# Patient Record
Sex: Female | Born: 1985 | Race: White | Hispanic: No | Marital: Single | Smoking: Former smoker
Health system: Southern US, Community
[De-identification: ages and names within clinical notes are randomized; demographics above are authoritative.]

---
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
## 2004-04-03 HISTORY — PX: CHOLECYSTECTOMY: SHX55

## 2004-11-22 ENCOUNTER — Encounter: Admission: RE | Admit: 2004-11-22 | Discharge: 2004-11-22 | Payer: Self-pay | Admitting: Family Medicine

## 2004-11-25 ENCOUNTER — Other Ambulatory Visit: Admission: RE | Admit: 2004-11-25 | Discharge: 2004-11-25 | Payer: Self-pay | Admitting: Gynecology

## 2004-12-02 ENCOUNTER — Encounter: Admission: RE | Admit: 2004-12-02 | Discharge: 2004-12-02 | Payer: Self-pay | Admitting: Gastroenterology

## 2004-12-14 ENCOUNTER — Encounter: Admission: RE | Admit: 2004-12-14 | Discharge: 2004-12-14 | Payer: Self-pay | Admitting: Gastroenterology

## 2005-01-25 ENCOUNTER — Emergency Department (HOSPITAL_COMMUNITY): Admission: EM | Admit: 2005-01-25 | Discharge: 2005-01-26 | Payer: Self-pay | Admitting: Emergency Medicine

## 2005-03-20 ENCOUNTER — Ambulatory Visit (HOSPITAL_COMMUNITY): Admission: RE | Admit: 2005-03-20 | Discharge: 2005-03-21 | Payer: Self-pay | Admitting: Surgery

## 2005-03-20 ENCOUNTER — Encounter (INDEPENDENT_AMBULATORY_CARE_PROVIDER_SITE_OTHER): Payer: Self-pay | Admitting: Specialist

## 2005-04-04 ENCOUNTER — Encounter: Admission: RE | Admit: 2005-04-04 | Discharge: 2005-04-04 | Payer: Self-pay | Admitting: Family Medicine

## 2005-08-09 ENCOUNTER — Other Ambulatory Visit: Admission: RE | Admit: 2005-08-09 | Discharge: 2005-08-09 | Payer: Self-pay | Admitting: Gynecology

## 2006-02-21 ENCOUNTER — Other Ambulatory Visit: Admission: RE | Admit: 2006-02-21 | Discharge: 2006-02-21 | Payer: Self-pay | Admitting: Gynecology

## 2007-11-21 ENCOUNTER — Emergency Department (HOSPITAL_COMMUNITY): Admission: EM | Admit: 2007-11-21 | Discharge: 2007-11-21 | Payer: Self-pay | Admitting: Emergency Medicine

## 2009-10-21 ENCOUNTER — Inpatient Hospital Stay (HOSPITAL_COMMUNITY): Admission: EM | Admit: 2009-10-21 | Discharge: 2009-10-23 | Payer: Self-pay | Admitting: Emergency Medicine

## 2010-06-18 LAB — DIFFERENTIAL
Basophils Absolute: 0.1 10*3/uL (ref 0.0–0.1)
Basophils Relative: 1 % (ref 0–1)
Eosinophils Absolute: 0.1 10*3/uL (ref 0.0–0.7)
Eosinophils Relative: 1 % (ref 0–5)
Lymphocytes Relative: 13 % (ref 12–46)
Monocytes Absolute: 0.7 10*3/uL (ref 0.1–1.0)
Monocytes Relative: 6 % (ref 3–12)
Neutro Abs: 9.4 10*3/uL — ABNORMAL HIGH (ref 1.7–7.7)
Neutrophils Relative %: 79 % — ABNORMAL HIGH (ref 43–77)

## 2010-06-18 LAB — CULTURE, BLOOD (ROUTINE X 2): Culture: NO GROWTH

## 2010-06-18 LAB — BASIC METABOLIC PANEL
Chloride: 107 mEq/L (ref 96–112)
Chloride: 110 mEq/L (ref 96–112)
Creatinine, Ser: 0.95 mg/dL (ref 0.4–1.2)
GFR calc Af Amer: >60 mL/min (ref >60)
GFR calc Af Amer: >60 mL/min (ref >60)
Potassium: 4 mEq/L (ref 3.5–5.1)

## 2010-06-18 LAB — CBC
HCT: 36.1 % (ref 36.0–46.0)
Hemoglobin: 13.2 g/dL (ref 12.0–15.0)
MCH: 33.1 pg (ref 26.0–34.0)
MCH: 33.2 pg (ref 26.0–34.0)
MCH: 34.1 pg — ABNORMAL HIGH (ref 26.0–34.0)
MCHC: 35.1 g/dL (ref 30.0–36.0)
MCV: 97 fL (ref 78.0–100.0)
Platelets: 218 10*3/uL (ref 150–400)
Platelets: 247 10*3/uL (ref 150–400)
RDW: 13.8 % (ref 11.5–15.5)
RDW: 14.1 % (ref 11.5–15.5)
RDW: 14.2 % (ref 11.5–15.5)

## 2010-06-18 LAB — POCT I-STAT, CHEM 8
Creatinine, Ser: 0.9 mg/dL (ref 0.4–1.2)
Glucose, Bld: 86 mg/dL (ref 70–99)
HCT: 45 % (ref 36.0–46.0)
TCO2: 24 mmol/L (ref 0–100)

## 2010-08-19 NOTE — Op Note (Signed)
NAME:  Kimberly Frey, Kimberly Frey NO.:  [id number]   MEDICAL RECORD NO.:  [id number]          PATIENT TYPE:  OIB   LOCATION:  5727                         FACILITY:  [facility]   PHYSICIAN:  Velora Heckler, MD      DATE OF BIRTH:  1986/03/23   DATE OF PROCEDURE:  03/20/2005  DATE OF DISCHARGE:  03/21/2005                                 OPERATIVE REPORT   PREOPERATIVE DIAGNOSIS:  Abdominal pain, probable biliary dyskinesia.   POSTOPERATIVE DIAGNOSIS:  Abdominal pain, probable biliary dyskinesia.   PROCEDURE:  Laparoscopic cholecystectomy with intraoperative  cholangiography.   SURGEON:  Velora Heckler, M.D.   ASSISTANT:  Ovidio Kin, M.D.   ANESTHESIA:  General per Dr. Judie Petit.   ESTIMATED BLOOD LOSS:  Minimal.   PREPARATION:  Betadine.   COMPLICATIONS:  None.   INDICATIONS:  The patient is a 25 year old white female from [place],  [place]. The patient has had an extensive workup under the direction  of Dr. Tasia Catchings.  The patient has intermittent abdominal pain. She has  fatty food intolerance. She has had low grade fever. Workup has included  plain abdominal x-rays, upper GI series, small-bowel follow-through,  abdominal ultrasound, nuclear medicine hepatobiliary scanning. The patient  now comes to surgery for cholecystectomy for management of intermittent  abdominal pain and suspicion of biliary dyskinesia.   DESCRIPTION OF PROCEDURE:  Procedure is done in OR #1 at the [facility]. The patient is brought to the operating room, placed in  supine position on the operating room table. Following administration of  general anesthesia the patient is prepped and draped in usual strict aseptic  fashion. After ascertaining that an adequate level of anesthesia had been  obtained, an infraumbilical incision is made a midline #15 blade. Dissection  was carried down to the fascia. Fascia was incised in the midline. The  peritoneal  cavity is entered cautiously. A 0 Vicryl pursestring suture is  placed in the fascia. An Hasson cannula is introduced and secured with a  pursestring suture. The abdomen is insufflated with carbon dioxide.  Laparoscope is introduced and the abdomen is explored. Operative ports are  placed along the right costal margin in the midline, midclavicular line and  anterior axillary line. Fundus of the gallbladder was grasped and retracted  cephalad. There are some omental adhesions to the undersurface of the  gallbladder. These are taken down with gentle blunt dissection and  hemostasis obtained with electrocautery. Dissection is begun at the neck of  the gallbladder. Peritoneum is incised. Cystic duct is dissected out along  its length. Clip is placed at the neck of the gallbladder. Cystic duct is  incised. Clear yellow bile emanates from the cystic duct. A Cook  cholangiography catheter is introduced through a stab wound in the right  upper quadrant and inserted into the peritoneal cavity. It is inserted into  the cystic duct and secured with a Ligaclip. Using C-arm fluoroscopy, real  time cholangiography is performed. There is rapid filling of a normal  caliber common bile duct. There is free flow distally into the  duodenum.  There is reflux of contrast into the right and left hepatic ductal systems.  There is no sign of obstruction or filling defect. Clip is withdrawn and the  [facility] catheter is removed from the peritoneal cavity. Cystic duct is doubly  clipped and divided. Cystic artery is dissected out, doubly clipped, and  divided. Gallbladder is then excised from the gallbladder bed using the hook  electrocautery for hemostasis. A posterior branch of the cystic artery is  clipped with a Ligaclip. Gallbladder was completely excised using the hook  electrocautery. It is extracted through the umbilical port without  difficulty. On palpation there are no palpable abnormalities and no stones  to  be noted. Right upper quadrant is irrigated with warm saline which is  evacuated. Good hemostasis is noted. Ports are removed under direct vision.  Good hemostasis is noted at all port sites. Pneumoperitoneum is released. A  0 Vicryl pursestring suture is tied securely. Port sites are anesthetized  with local anesthetic. All wounds are closed with interrupted 4-0 Vicryl  subcuticular sutures. Wounds are washed and dried. Benzoin and Steri-Strips  are applied. Sterile dressings are applied. The patient is awakened from  anesthesia and brought to the recovery room in stable condition. The patient  tolerated the procedure well.      Velora Heckler, MD  Electronically Signed     TMG/MEDQ  D:  03/20/2005  T:  03/21/2005  Job:  [id number]   cc:   Tasia Catchings, M.D.  Fax: [phone]   Roxy Horseman, M.D.   Jasmine Pang, M.D.  Fax: [phone]

## 2012-02-28 IMAGING — CT CT NECK W/ CM
4 of 5 series · 16 of 33 positions shown, 19 images · IV contrast (75ml omni 300)
Comparison: None

CLINICAL DATA: Left ear pain.  Fever.  Elevated white count.

CT NECK WITH CONTRAST
TECHNIQUE: Multidetector CT imaging of the neck was performed with
intravenous contrast.
Contrast: 75 ml 4mnipaque-3GG IV

[Series 2: 2cc/30ml and 1cc/45ml · axial · 0.46mm/px · z∈[-187,-67]mm · 3 of 97 slices shown]
[im 25/97  bone]
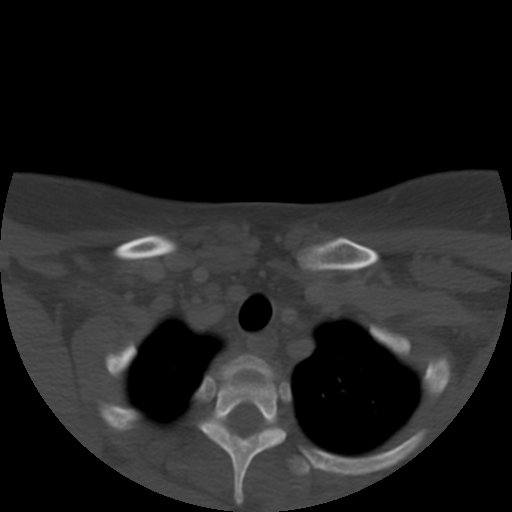
[im 49/97  bone]
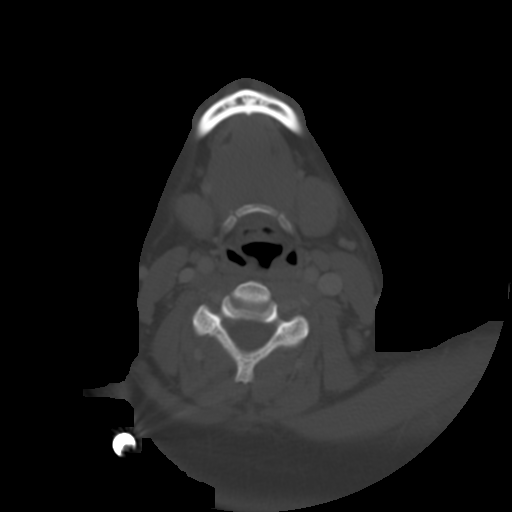
[im 73/97  bone]
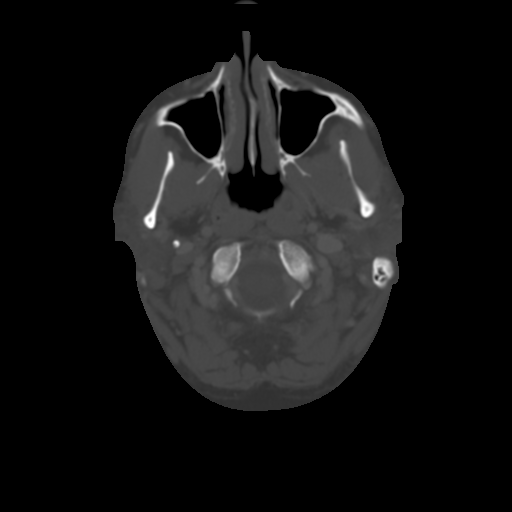

[Series 300: sag · sagittal · 0.53mm/px · 5 of 73 slices shown, 6 images]
[im 25/73  bone]
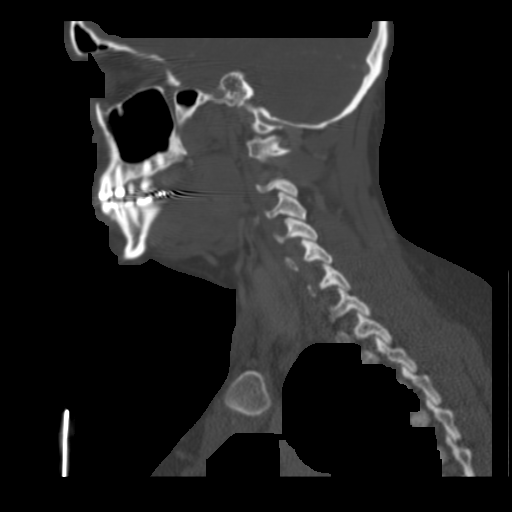
[im 31/73  bone]
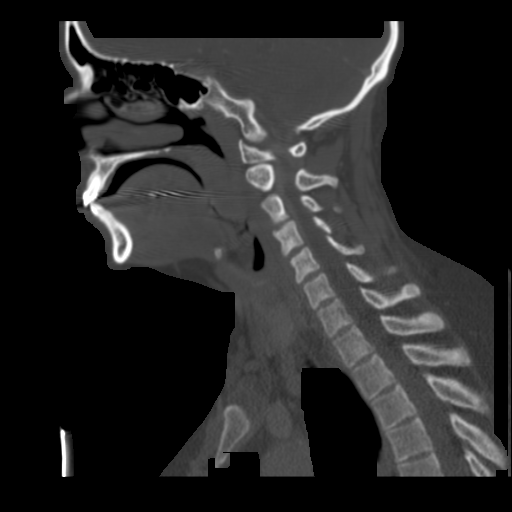
[im 37/73  soft-tissue]
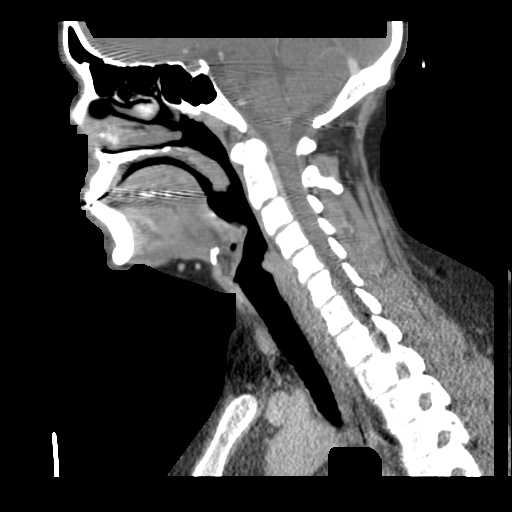
[im 37/73  bone]
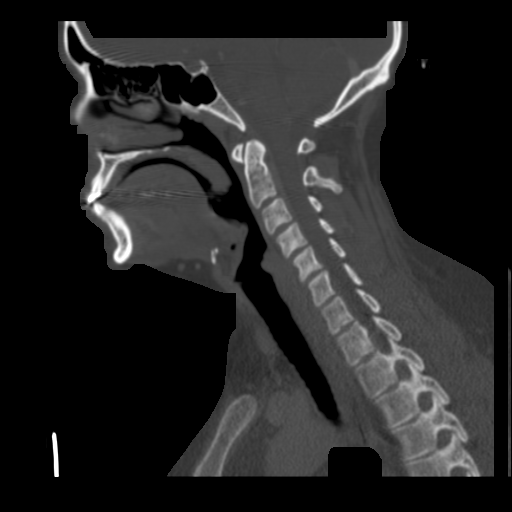
[im 43/73  bone]
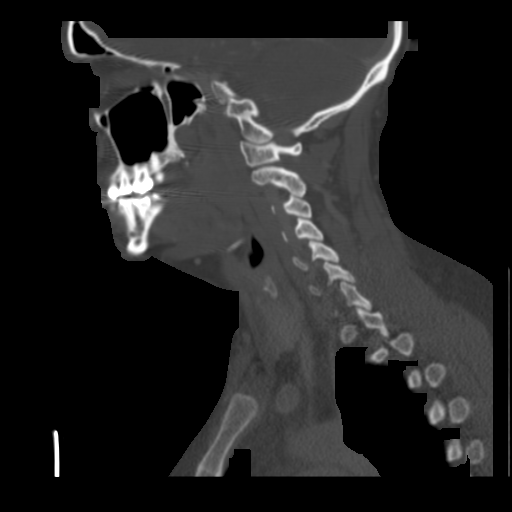
[im 49/73  bone]
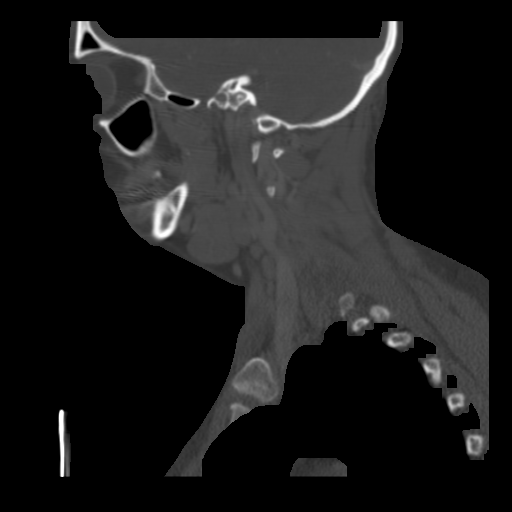

[Series 301: cor · coronal · 0.53mm/px · 3 of 92 slices shown (1 of 2)]
[im 27/92  bone]
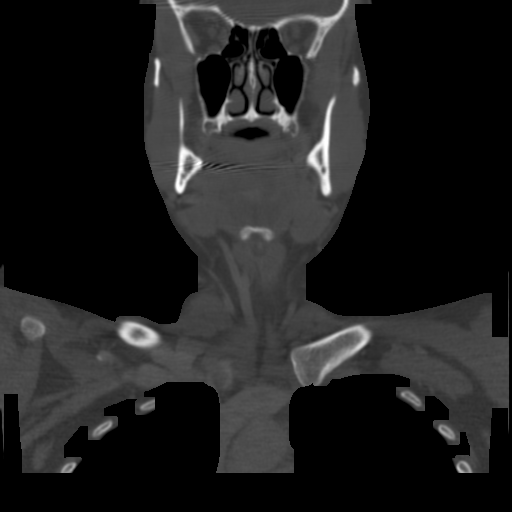
[im 40/92  bone]
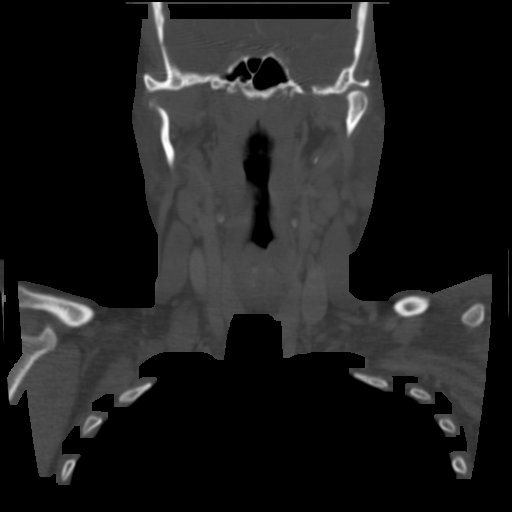
[im 53/92  bone]
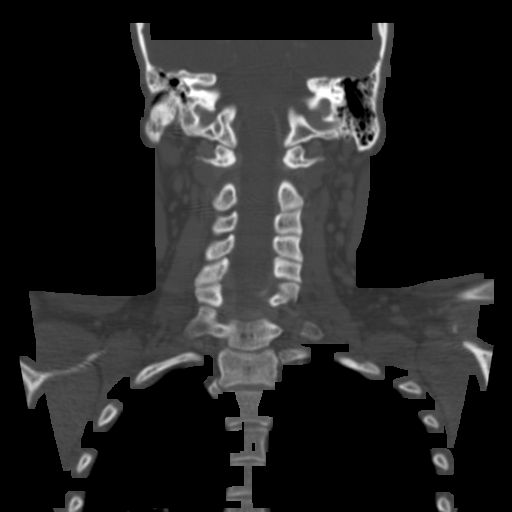

[Series 302: cor · axial · 0.53mm/px · z∈[-261,-85]mm · 5 of 146 slices shown, 7 images (2 of 2)]
[im 25/146  soft-tissue]
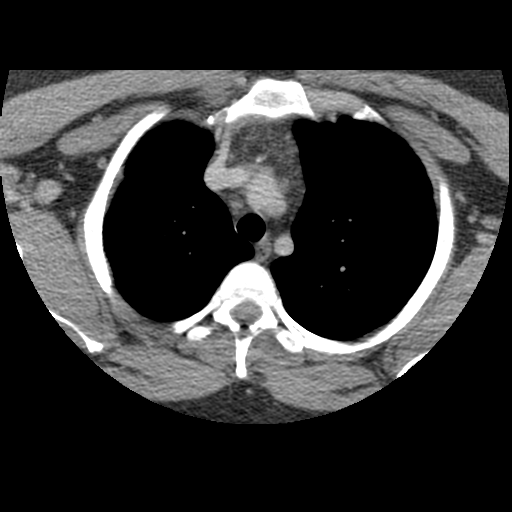
[im 25/146  bone]
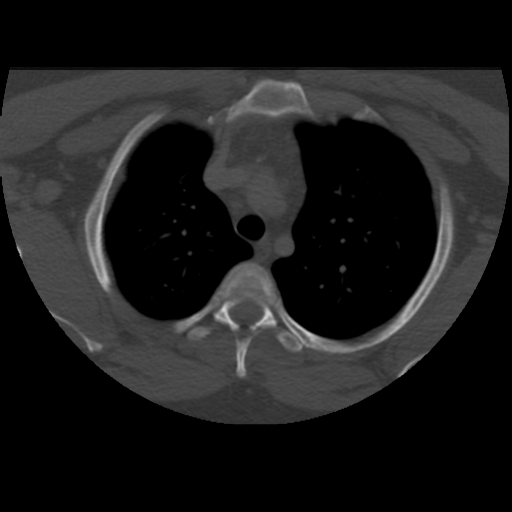
[im 49/146  bone]
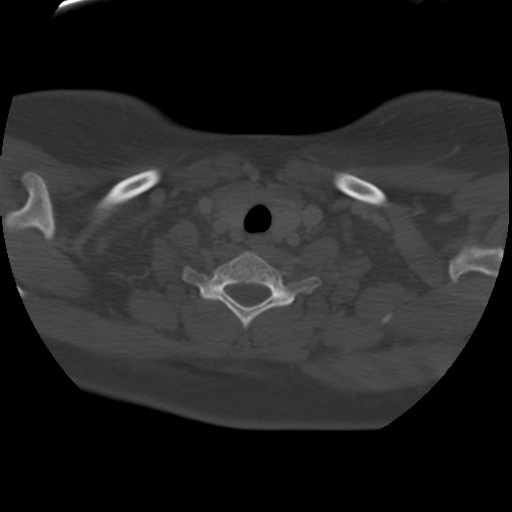
[im 73/146  bone]
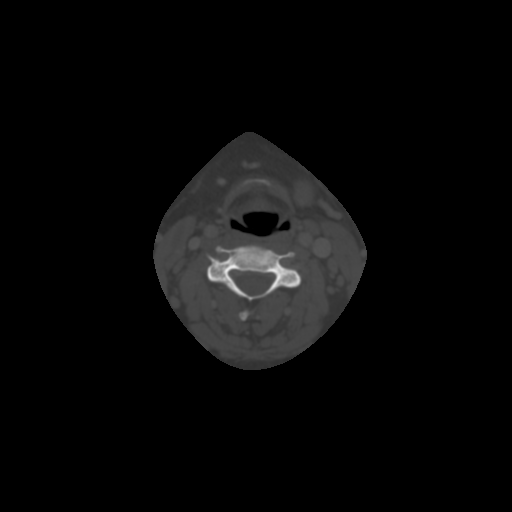
[im 97/146  bone]
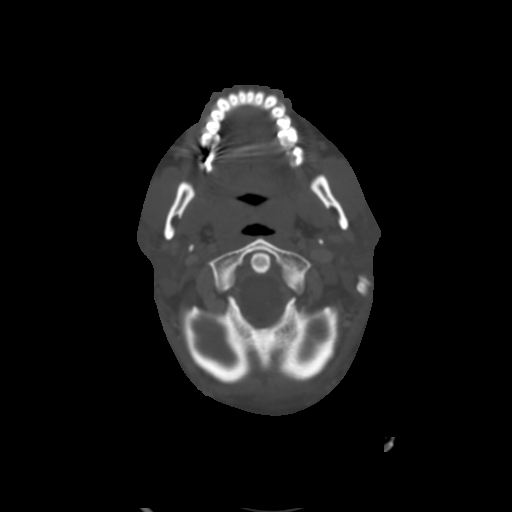
[im 121/146  soft-tissue]
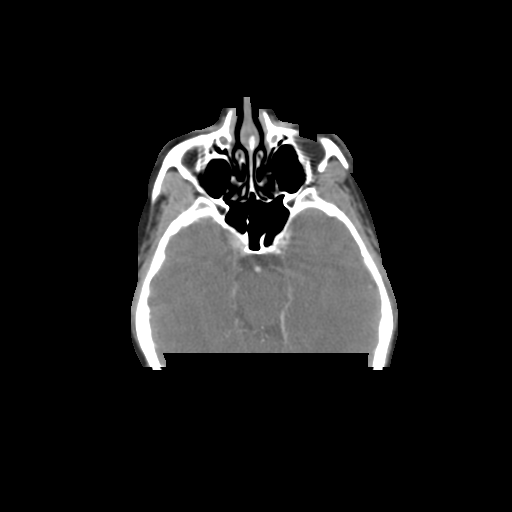
[im 121/146  bone]
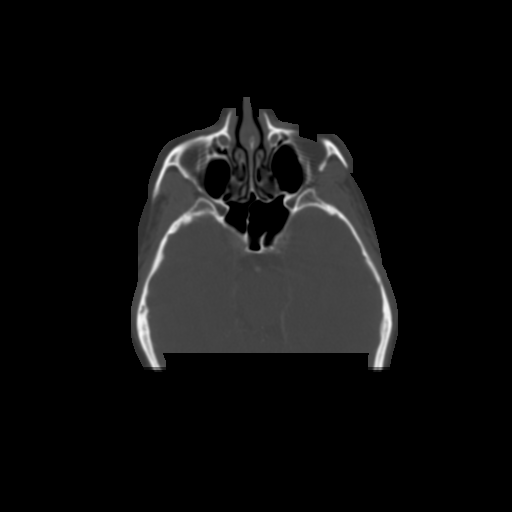

[16 of 33 positions shown; findings below may reference images not displayed]

FINDINGS: There is skin thickening and subcutaneous edema around
the left ear.  This is located superior to the ear, posterior to
the ear and inferior to the ear.  No abscess is present.  There are
multiple lymph nodes on the left.  There is a 12 mm left lower
parotid lymph node.  Left level II lymph nodes measure 11 x 14 mm,
10 x 17 mm and 7 x 11 mm.  Multiple level IV nodes are present, the
largest is 16 mm in long axis.  These nodes are homogeneous and are
not necrotic.  The remainder of the parotid gland is normal.  The
submandibular gland is normal.

There is mild adenopathy in the right neck with a 13 x 10 mm right
level II node and smaller right level II nodes also present.  The
pharyngeal tonsils are prominent but symmetric, likely related to
pharyngitis.  Negative for tonsillar abscess.

The visualized paranasal sinuses are clear.  The mastoid sinus is
clear bilaterally without evidence of mastoiditis.  The middle ears
clear bilaterally.  No acute bony abnormality.
IMPRESSION: Cellulitis around the left year.  No abscess is present.  There are
multiple enlarged lymph nodes in the left neck likely related to
the acute infection.

Pharyngeal tonsils are prominent bilaterally but without abscess.
This may be due to acute or chronic tonsilitis.

## 2016-01-21 LAB — OB RESULTS CONSOLE ABO/RH: RH Type: NEGATIVE

## 2016-01-21 LAB — OB RESULTS CONSOLE HEPATITIS B SURFACE ANTIGEN: HEP B S AG: NEGATIVE

## 2016-01-24 LAB — OB RESULTS CONSOLE RUBELLA ANTIBODY, IGM: Rubella: IMMUNE

## 2016-01-24 LAB — OB RESULTS CONSOLE RPR: RPR: NONREACTIVE

## 2016-01-24 LAB — OB RESULTS CONSOLE HIV ANTIBODY (ROUTINE TESTING): HIV: NONREACTIVE

## 2016-02-02 ENCOUNTER — Encounter: Payer: Self-pay | Admitting: Obstetrics & Gynecology

## 2016-02-04 ENCOUNTER — Other Ambulatory Visit (HOSPITAL_COMMUNITY)
Admission: RE | Admit: 2016-02-04 | Discharge: 2016-02-04 | Disposition: A | Discharge status: Home / Self Care | Payer: Medicaid Other | Source: Ambulatory Visit | Attending: Obstetrics and Gynecology | Admitting: Obstetrics and Gynecology

## 2016-02-04 ENCOUNTER — Other Ambulatory Visit: Payer: Self-pay | Admitting: Obstetrics and Gynecology

## 2016-02-04 DIAGNOSIS — Z1151 Encounter for screening for human papillomavirus (HPV): Secondary | ICD-10-CM | POA: Insufficient documentation

## 2016-02-04 DIAGNOSIS — Z01419 Encounter for gynecological examination (general) (routine) without abnormal findings: Secondary | ICD-10-CM | POA: Insufficient documentation

## 2016-02-04 LAB — OB RESULTS CONSOLE GC/CHLAMYDIA: CHLAMYDIA, DNA PROBE: NEGATIVE

## 2016-02-08 LAB — CYTOLOGY - PAP
Diagnosis: NEGATIVE
HPV (WINDOPATH): NOT DETECTED

## 2016-06-05 ENCOUNTER — Encounter: Payer: Medicaid Other | Attending: Obstetrics and Gynecology

## 2016-06-05 DIAGNOSIS — Z3A00 Weeks of gestation of pregnancy not specified: Secondary | ICD-10-CM | POA: Diagnosis not present

## 2016-06-05 DIAGNOSIS — O99810 Abnormal glucose complicating pregnancy: Secondary | ICD-10-CM | POA: Insufficient documentation

## 2016-06-05 NOTE — Progress Notes (Signed)
  Patient was seen on 06/05/16 for Gestational Diabetes self-management class at the [facility]. The following learning objectives were met by the patient during this course:   States the definition of Gestational Diabetes  States why dietary management is important in controlling blood glucose  Describes the effects each nutrient has on blood glucose levels  Demonstrates ability to create a balanced meal plan  Demonstrates carbohydrate counting   States when to check blood glucose levels  Demonstrates proper blood glucose monitoring techniques  States the effect of stress and exercise on blood glucose levels  States the importance of limiting caffeine and abstaining from alcohol and smoking  Blood glucose monitor given: yes Lot # [id number] Exp: 07/24/17 Blood glucose reading: 96  Patient instructed to monitor glucose levels: FBS: 60 - <90 1 hour: <140 2 hour: <120  *Patient received handouts:  Nutrition Diabetes and Pregnancy  Carbohydrate Counting List  Patient will be seen for follow-up as needed.

## 2016-07-02 LAB — OB RESULTS CONSOLE GBS: GBS: POSITIVE

## 2016-08-18 ENCOUNTER — Encounter (HOSPITAL_COMMUNITY): Payer: Self-pay | Admitting: *Deleted

## 2016-08-18 ENCOUNTER — Inpatient Hospital Stay (HOSPITAL_COMMUNITY)
Admission: AD | Admit: 2016-08-18 | Discharge: 2016-08-23 | DRG: 765 | Disposition: A | Discharge status: Home / Self Care | Payer: Medicaid Other | Source: Ambulatory Visit | Attending: Obstetrics and Gynecology | Admitting: Obstetrics and Gynecology

## 2016-08-18 DIAGNOSIS — O4202 Full-term premature rupture of membranes, onset of labor within 24 hours of rupture: Secondary | ICD-10-CM | POA: Diagnosis present

## 2016-08-18 DIAGNOSIS — Z6791 Unspecified blood type, Rh negative: Secondary | ICD-10-CM | POA: Diagnosis not present

## 2016-08-18 DIAGNOSIS — O99214 Obesity complicating childbirth: Secondary | ICD-10-CM | POA: Diagnosis present

## 2016-08-18 DIAGNOSIS — O26893 Other specified pregnancy related conditions, third trimester: Secondary | ICD-10-CM | POA: Diagnosis present

## 2016-08-18 DIAGNOSIS — B951 Streptococcus, group B, as the cause of diseases classified elsewhere: Secondary | ICD-10-CM

## 2016-08-18 DIAGNOSIS — O99344 Other mental disorders complicating childbirth: Secondary | ICD-10-CM | POA: Diagnosis present

## 2016-08-18 DIAGNOSIS — Z3A38 38 weeks gestation of pregnancy: Secondary | ICD-10-CM | POA: Diagnosis not present

## 2016-08-18 DIAGNOSIS — Z98891 History of uterine scar from previous surgery: Secondary | ICD-10-CM

## 2016-08-18 DIAGNOSIS — F419 Anxiety disorder, unspecified: Secondary | ICD-10-CM | POA: Diagnosis present

## 2016-08-18 DIAGNOSIS — O4290 Premature rupture of membranes, unspecified as to length of time between rupture and onset of labor, unspecified weeks of gestation: Secondary | ICD-10-CM | POA: Diagnosis present

## 2016-08-18 DIAGNOSIS — Z87891 Personal history of nicotine dependence: Secondary | ICD-10-CM

## 2016-08-18 DIAGNOSIS — F329 Major depressive disorder, single episode, unspecified: Secondary | ICD-10-CM | POA: Diagnosis present

## 2016-08-18 DIAGNOSIS — O99824 Streptococcus B carrier state complicating childbirth: Secondary | ICD-10-CM | POA: Diagnosis present

## 2016-08-18 DIAGNOSIS — Z6841 Body Mass Index (BMI) 40.0 and over, adult: Secondary | ICD-10-CM

## 2016-08-18 DIAGNOSIS — O26899 Other specified pregnancy related conditions, unspecified trimester: Secondary | ICD-10-CM

## 2016-08-18 MED ORDER — PENICILLIN G POT IN DEXTROSE 60000 UNIT/ML IV SOLN
3.0000 10*6.[IU] | INTRAVENOUS | Status: DC
  Administered 2016-08-19 – 2016-08-20 (×6): 3 10*6.[IU] via INTRAVENOUS
  Filled 2016-08-18 (×9): qty 50

## 2016-08-18 MED ORDER — FLEET ENEMA 7-19 GM/118ML RE ENEM: 1.0000 | ENEMA | RECTAL | Status: DC | PRN

## 2016-08-18 MED ORDER — OXYTOCIN 40 UNITS IN LACTATED RINGERS INFUSION - SIMPLE MED: 2.5000 [IU]/h | INTRAVENOUS | Status: DC

## 2016-08-18 MED ORDER — OXYTOCIN BOLUS FROM INFUSION: 500.0000 mL | Freq: Once | INTRAVENOUS | Status: DC

## 2016-08-18 MED ORDER — HYDROXYZINE HCL 50 MG PO TABS
50.0000 mg | ORAL_TABLET | Freq: Four times a day (QID) | ORAL | Status: DC | PRN
  Administered 2016-08-19 – 2016-08-20 (×5): 50 mg via ORAL
  Filled 2016-08-18 (×5): qty 1

## 2016-08-18 MED ORDER — ONDANSETRON HCL 4 MG/2ML IJ SOLN: 4.0000 mg | Freq: Four times a day (QID) | INTRAMUSCULAR | Status: DC | PRN

## 2016-08-18 MED ORDER — SOD CITRATE-CITRIC ACID 500-334 MG/5ML PO SOLN
30.0000 mL | ORAL | Status: DC | PRN
  Administered 2016-08-19: 30 mL via ORAL
  Filled 2016-08-18 (×2): qty 15

## 2016-08-18 MED ORDER — BUTORPHANOL TARTRATE 1 MG/ML IJ SOLN
1.0000 mg | INTRAMUSCULAR | Status: DC | PRN
  Administered 2016-08-19: 1 mg via INTRAVENOUS
  Filled 2016-08-18: qty 1

## 2016-08-18 MED ORDER — ACETAMINOPHEN 325 MG PO TABS: 650.0000 mg | ORAL_TABLET | ORAL | Status: DC | PRN

## 2016-08-18 MED ORDER — OXYCODONE-ACETAMINOPHEN 5-325 MG PO TABS: 2.0000 | ORAL_TABLET | ORAL | Status: DC | PRN

## 2016-08-18 MED ORDER — LACTATED RINGERS IV SOLN: 500.0000 mL | INTRAVENOUS | Status: DC | PRN

## 2016-08-18 MED ORDER — DEXTROSE 5 % IV SOLN
5.0000 10*6.[IU] | Freq: Once | INTRAVENOUS | Status: AC
  Administered 2016-08-19: 5 10*6.[IU] via INTRAVENOUS
  Filled 2016-08-18: qty 5

## 2016-08-18 MED ORDER — LACTATED RINGERS IV SOLN
INTRAVENOUS | Status: DC
Start: 2016-08-19 — End: 2016-08-20
  Administered 2016-08-19 – 2016-08-20 (×6): via INTRAVENOUS

## 2016-08-18 MED ORDER — LIDOCAINE HCL (PF) 1 % IJ SOLN
30.0000 mL | INTRAMUSCULAR | Status: DC | PRN
  Filled 2016-08-18: qty 30

## 2016-08-18 MED ORDER — OXYCODONE-ACETAMINOPHEN 5-325 MG PO TABS: 1.0000 | ORAL_TABLET | ORAL | Status: DC | PRN

## 2016-08-18 NOTE — MAU Note (Signed)
Pt. arrived grossly ruptured with clear fluid. SROM at 2115 tonight.  Positive for fetal movement, denies vaginal bleeding[facility]

## 2016-08-18 NOTE — H&P (Signed)
Kimberly Frey is a 31 y.o. female, G1P0 @ 38.1 wks by sure LMP, c/w ultrasound @ 8.4 wks (EDD 09/01/16), presenting w/ PROM, clear fluid, since 21:15 last evening. Endorses FM. Denies ctxs, VB, epigastric pain or difficulty breathing.  Pt has very early prenatal care with [facility] (Dr. Dion BodyVarnado) and significant for:  1) Anxiety - on Buspar, Prozac and Atarax 2) GBS positive 3) Smoker-quit with pregnancy 4) Rh neg  OB History    Gravida Para Term Preterm AB Living   1             SAB TAB Ectopic Multiple Live Births                 History reviewed. No pertinent past medical history. Past Surgical History:  Procedure Laterality Date  . CHOLECYSTECTOMY  2006   Family History: Heart disease and HTN in her dad.  Social History:  reports that she has quit smoking. She has never used smokeless tobacco. Her alcohol and drug histories are not on file.     Maternal Diabetes: No Genetic Screening: Declined Maternal Ultrasounds/Referrals: Normal Fetal Ultrasounds or other Referrals:  None Maternal Substance Abuse:  No Significant Maternal Medications:  Meds include: Other: Buspar, Prozac Significant Maternal Lab Results:  Lab values include: Group B Strep positive Other Comments:  Flu 02/04/16  ROS 10 Systems reviewed and are negative for acute change except as noted in the HPI.  History Exam  Dilation: 1 Effacement (%): 20 Station: Ballotable Exam by:: Millner, RN  Blood pressure (!) 145/82, pulse 78, temperature 98.1 F (36.7 C), temperature source Oral, resp. rate 18, height 5\' 3"  (1.6 m), weight 115.2 kg (254 lb), SpO2 98 %.  EFW: 7-8 lbs; adequate for trial of labor  Cephalic by Thayer OhmLeopold maneuvers and VE  FHRT: Baseline 115 w/ moderate variability, +accels, no decels Toco: None   Physical Exam  Nursing noteand vitalsreviewed. Neurological: She has normal reflexes.  Constitutional: She is oriented to person, place, and time. She appears well-developed and  well-nourished.  HENT:  Head: Normocephalic and atraumatic.  Neck: Normal range of motion.  Cardiovascular: Normal rate, regular rhythm and normal heart sounds.  Respiratory: Effort normal and breath sounds normal.  GI: Soft. Bowel sounds are normal. Abdomen is gravid.  Skin: Warm and dry.  Musculoskeletal: Exhibits 1+ lower extremity edema. Psychiatric: She has a normal affect. She is extremely anxious.   Prenatal labs: ABO, Rh: --/--/O NEG (05/18 2335) Antibody: NEG (05/18 2335) Rubella: Immune (10/23 0000) RPR: Nonreactive (10/23 0000)  HBsAg: Neg (01/21/16)  HIV: Non-reactive (10/23 0000)  GBS: Per pt report Pap smear neg/HPV neg: 02/04/16  Results for orders placed or performed during the hospital encounter of 08/18/16 (from the past 24 hour(s))  CBC     Status: Abnormal   Collection Time: 08/18/16 11:35 PM  Result Value Ref Range   WBC 16.0 (H) 4.0 - 10.5 K/uL   RBC 3.60 (L) 3.87 - 5.11 MIL/uL   Hemoglobin 11.3 (L) 12.0 - 15.0 g/dL   HCT 16.134.4 (L) 09.636.0 - 04.546.0 %   MCV 95.6 78.0 - 100.0 fL   MCH 31.4 26.0 - 34.0 pg   MCHC 32.8 30.0 - 36.0 g/dL   RDW 40.915.2 81.111.5 - 91.415.5 %   Platelets 354 150 - 400 K/uL  Type and screen [facility]     Status: None   Collection Time: 08/18/16 11:35 PM  Result Value Ref Range   ABO/RH(D) O NEG  Antibody Screen NEG    Sample Expiration 08/21/2016   Comprehensive metabolic panel     Status: Abnormal   Collection Time: 08/18/16 11:35 PM  Result Value Ref Range   Sodium 135 135 - 145 mmol/L   Potassium 3.9 3.5 - 5.1 mmol/L   Chloride 105 101 - 111 mmol/L   CO2 20 (L) 22 - 32 mmol/L   Glucose, Bld 93 65 - 99 mg/dL   BUN 9 6 - 20 mg/dL   Creatinine, Ser 1.61 (H) 0.44 - 1.00 mg/dL   Calcium 8.8 (L) 8.9 - 10.3 mg/dL   Total Protein 6.4 (L) 6.5 - 8.1 g/dL   Albumin 2.6 (L) 3.5 - 5.0 g/dL   AST 15 15 - 41 U/L   ALT 10 (L) 14 - 54 U/L   Alkaline Phosphatase 121 38 - 126 U/L   Total Bilirubin 0.1 (L) 0.3 - 1.2 mg/dL    GFR calc non Af Amer >60 >60 mL/min   GFR calc Af Amer >60 >60 mL/min   Anion gap 10 5 - 15  Lactate dehydrogenase     Status: None   Collection Time: 08/18/16 11:35 PM  Result Value Ref Range   LDH 116 98 - 192 U/L  Uric acid     Status: None   Collection Time: 08/18/16 11:35 PM  Result Value Ref Range   Uric Acid, Serum 5.8 2.3 - 6.6 mg/dL    Assessment: IUP at 09.6 wks Latent phase labor PROM, clear fluid x 7 hrs; no s/s of infection FWB: Overall reassuring with low baseline GBS positive Obesity Anxiety disorder Elevated BPs Rh neg  Plan:  Admit to [employer]. Routine orders. Add-on preE labs and urine PCR due to elevated BPs. Expectant management initially, now will proceed w/ Pitocin. PCN for GBS positive status. Pain med/epidural prn. Expect SVD.  IV antihypertensive therapy for severe range pressures. Strict I&O.  Sherre Scarlet 08/19/2016, 4:15 AM

## 2016-08-19 ENCOUNTER — Encounter (HOSPITAL_COMMUNITY): Payer: Self-pay

## 2016-08-19 ENCOUNTER — Inpatient Hospital Stay (HOSPITAL_COMMUNITY): Payer: Medicaid Other | Admitting: Anesthesiology

## 2016-08-19 DIAGNOSIS — Z6791 Unspecified blood type, Rh negative: Secondary | ICD-10-CM

## 2016-08-19 DIAGNOSIS — O26899 Other specified pregnancy related conditions, unspecified trimester: Secondary | ICD-10-CM

## 2016-08-19 DIAGNOSIS — B951 Streptococcus, group B, as the cause of diseases classified elsewhere: Secondary | ICD-10-CM

## 2016-08-19 DIAGNOSIS — F419 Anxiety disorder, unspecified: Secondary | ICD-10-CM

## 2016-08-19 LAB — URINALYSIS, COMPLETE (UACMP) WITH MICROSCOPIC
BACTERIA UA: NONE SEEN
Bilirubin Urine: NEGATIVE
Glucose, UA: NEGATIVE mg/dL
Hgb urine dipstick: NEGATIVE
KETONES UR: NEGATIVE mg/dL
Leukocytes, UA: NEGATIVE
Nitrite: NEGATIVE
PROTEIN: NEGATIVE mg/dL
Specific Gravity, Urine: 1.003 — ABNORMAL LOW (ref 1.005–1.030)
pH: 6 (ref 5.0–8.0)

## 2016-08-19 LAB — CBC
HEMATOCRIT: 33.7 % — AB (ref 36.0–46.0)
HEMATOCRIT: 34.4 % — AB (ref 36.0–46.0)
HEMOGLOBIN: 11.3 g/dL — AB (ref 12.0–15.0)
HEMOGLOBIN: 11.3 g/dL — AB (ref 12.0–15.0)
MCH: 31.4 pg (ref 26.0–34.0)
MCH: 32.3 pg (ref 26.0–34.0)
MCHC: 32.8 g/dL (ref 30.0–36.0)
MCHC: 33.5 g/dL (ref 30.0–36.0)
MCV: 95.6 fL (ref 78.0–100.0)
MCV: 96.3 fL (ref 78.0–100.0)
PLATELETS: 354 10*3/uL (ref 150–400)
Platelets: 322 10*3/uL (ref 150–400)
RBC: 3.5 MIL/uL — AB (ref 3.87–5.11)
RBC: 3.6 MIL/uL — AB (ref 3.87–5.11)
RDW: 15.2 % (ref 11.5–15.5)
RDW: 15.4 % (ref 11.5–15.5)
WBC: 16 10*3/uL — ABNORMAL HIGH (ref 4.0–10.5)
WBC: 16.6 10*3/uL — ABNORMAL HIGH (ref 4.0–10.5)

## 2016-08-19 LAB — LACTATE DEHYDROGENASE: LDH: 116 U/L (ref 98–192)

## 2016-08-19 LAB — COMPREHENSIVE METABOLIC PANEL
ALK PHOS: 121 U/L (ref 38–126)
ALT: 10 U/L — ABNORMAL LOW (ref 14–54)
ANION GAP: 10 (ref 5–15)
AST: 15 U/L (ref 15–41)
Albumin: 2.6 g/dL — ABNORMAL LOW (ref 3.5–5.0)
BILIRUBIN TOTAL: 0.1 mg/dL — AB (ref 0.3–1.2)
BUN: 9 mg/dL (ref 6–20)
CALCIUM: 8.8 mg/dL — AB (ref 8.9–10.3)
CO2: 20 mmol/L — ABNORMAL LOW (ref 22–32)
Chloride: 105 mmol/L (ref 101–111)
Creatinine, Ser: 1.01 mg/dL — ABNORMAL HIGH (ref 0.44–1.00)
GFR calc Af Amer: >60 mL/min (ref >60)
Glucose, Bld: 93 mg/dL (ref 65–99)
POTASSIUM: 3.9 mmol/L (ref 3.5–5.1)
Sodium: 135 mmol/L (ref 135–145)
TOTAL PROTEIN: 6.4 g/dL — AB (ref 6.5–8.1)

## 2016-08-19 LAB — PROTEIN / CREATININE RATIO, URINE
Creatinine, Urine: 26 mg/dL
Total Protein, Urine: <6 mg/dL

## 2016-08-19 LAB — URIC ACID: Uric Acid, Serum: 5.8 mg/dL (ref 2.3–6.6)

## 2016-08-19 LAB — TYPE AND SCREEN
ABO/RH(D): O NEG
Antibody Screen: NEGATIVE

## 2016-08-19 LAB — ABO/RH: ABO/RH(D): O NEG

## 2016-08-19 LAB — RPR: RPR: NONREACTIVE

## 2016-08-19 MED ORDER — DIPHENHYDRAMINE HCL 50 MG/ML IJ SOLN: 12.5000 mg | INTRAMUSCULAR | Status: DC | PRN

## 2016-08-19 MED ORDER — SODIUM CHLORIDE 0.9 % IV SOLN
3.0000 g | Freq: Four times a day (QID) | INTRAVENOUS | Status: DC
  Administered 2016-08-19 – 2016-08-20 (×2): 3 g via INTRAVENOUS
  Filled 2016-08-19 (×3): qty 3

## 2016-08-19 MED ORDER — LIDOCAINE HCL (PF) 1 % IJ SOLN
INTRAMUSCULAR | Status: DC | PRN
  Administered 2016-08-19 (×2): 5 mL

## 2016-08-19 MED ORDER — BUSPIRONE HCL 15 MG PO TABS
30.0000 mg | ORAL_TABLET | Freq: Every day | ORAL | Status: DC
  Administered 2016-08-19 (×2): 30 mg via ORAL
  Filled 2016-08-19 (×2): qty 2

## 2016-08-19 MED ORDER — EPHEDRINE 5 MG/ML INJ: 10.0000 mg | INTRAVENOUS | Status: DC | PRN

## 2016-08-19 MED ORDER — OXYTOCIN 40 UNITS IN LACTATED RINGERS INFUSION - SIMPLE MED
1.0000 m[IU]/min | INTRAVENOUS | Status: DC
  Administered 2016-08-19: 3 m[IU]/min via INTRAVENOUS
  Administered 2016-08-19: 1 m[IU]/min via INTRAVENOUS
  Filled 2016-08-19: qty 1000

## 2016-08-19 MED ORDER — FLUOXETINE HCL 20 MG PO CAPS
20.0000 mg | ORAL_CAPSULE | Freq: Every day | ORAL | Status: DC
  Administered 2016-08-19 (×2): 20 mg via ORAL
  Filled 2016-08-19 (×2): qty 1

## 2016-08-19 MED ORDER — PHENYLEPHRINE 40 MCG/ML (10ML) SYRINGE FOR IV PUSH (FOR BLOOD PRESSURE SUPPORT)
80.0000 ug | PREFILLED_SYRINGE | INTRAVENOUS | Status: DC | PRN
  Administered 2016-08-19 (×2): 80 ug via INTRAVENOUS
  Filled 2016-08-19: qty 10

## 2016-08-19 MED ORDER — LACTATED RINGERS IV SOLN: 500.0000 mL | Freq: Once | INTRAVENOUS | Status: DC

## 2016-08-19 MED ORDER — FENTANYL 2.5 MCG/ML BUPIVACAINE 1/10 % EPIDURAL INFUSION (WH - ANES)
14.0000 mL/h | INTRAMUSCULAR | Status: DC | PRN
  Administered 2016-08-19 – 2016-08-20 (×4): 14 mL/h via EPIDURAL
  Filled 2016-08-19 (×5): qty 100

## 2016-08-19 MED ORDER — PHENYLEPHRINE 40 MCG/ML (10ML) SYRINGE FOR IV PUSH (FOR BLOOD PRESSURE SUPPORT): 80.0000 ug | PREFILLED_SYRINGE | INTRAVENOUS | Status: DC | PRN

## 2016-08-19 MED ORDER — TERBUTALINE SULFATE 1 MG/ML IJ SOLN: 0.2500 mg | Freq: Once | INTRAMUSCULAR | Status: DC | PRN

## 2016-08-19 MED ORDER — FENTANYL CITRATE (PF) 100 MCG/2ML IJ SOLN
100.0000 ug | INTRAMUSCULAR | Status: DC | PRN
  Administered 2016-08-19: 100 ug via INTRAVENOUS
  Filled 2016-08-19: qty 2

## 2016-08-19 NOTE — Progress Notes (Signed)
Pt changed from blue cuff no appropriate size burgandy cuff.

## 2016-08-19 NOTE — Progress Notes (Signed)
Pitocin to remain off until CNM comes in to see pt.

## 2016-08-19 NOTE — Anesthesia Preprocedure Evaluation (Signed)
Anesthesia Evaluation  Patient identified by MRN, date of birth, ID band Patient awake    Reviewed: Allergy & Precautions, H&P , NPO status , Patient's Chart, lab work & pertinent test results  History of Anesthesia Complications Negative for: history of anesthetic complications  Airway Mallampati: II  TM Distance: >3 FB Neck ROM: full    Dental no notable dental hx. (+) Teeth Intact   Pulmonary neg pulmonary ROS, former smoker,    Pulmonary exam normal breath sounds clear to auscultation       Cardiovascular negative cardio ROS Normal cardiovascular exam Rhythm:regular Rate:Normal     Neuro/Psych negative neurological ROS  negative psych ROS   GI/Hepatic negative GI ROS, Neg liver ROS,   Endo/Other  Morbid obesity  Renal/GU negative Renal ROS  negative genitourinary   Musculoskeletal   Abdominal   Peds  Hematology negative hematology ROS (+)   Anesthesia Other Findings   Reproductive/Obstetrics (+) Pregnancy                             Anesthesia Physical Anesthesia Plan  ASA: III  Anesthesia Plan: Epidural   Post-op Pain Management:    Induction:   Airway Management Planned:   Additional Equipment:   Intra-op Plan:   Post-operative Plan:   Informed Consent: I have reviewed the patients History and Physical, chart, labs and discussed the procedure including the risks, benefits and alternatives for the proposed anesthesia with the patient or authorized representative who has indicated his/her understanding and acceptance.     Plan Discussed with:   Anesthesia Plan Comments:         Anesthesia Quick Evaluation  

## 2016-08-19 NOTE — Progress Notes (Signed)
Kimberly Frey is a 31 y.o. female, G1P0 @ 38.1 wks admitted last evening for ROM. FOB at bedside.  Subjective: States very anxious, requesting anxiety medication. Endorses FM. Denies VB, epigastric pain or difficulty breathing. Feels pressure, but not ctxs.      Objective: BP 137/78   Pulse 93   Temp 98.6 F (37 C) (Oral)   Resp 18   Ht 5\' 3"  (1.6 m)   Wt 115.2 kg (254 lb)   SpO2 98%   BMI 44.99 kg/m  I/O last 3 completed shifts: In: 3364.3 [P.O.:450; I.V.:2764.3; IV Piggyback:150] Out: 2000 [Urine:1750; Emesis/NG output:250]  Today's Vitals   08/19/16 2130 08/19/16 2132 08/19/16 2135 08/19/16 2140  BP:  (!) 141/75    Pulse: 95 98 95 90  Resp:  18    Temp:  99.8 F (37.7 C)    TempSrc:  Axillary    SpO2:      Weight:      Height:      PainSc:  4       FHT: BL 125 w/ mod variability, +accels, non-persistent variables, no lates UC:   irregular, every 2-3 minutes, MVUs 160 SVE:   Dilation: 9 Effacement (%): 100 Station: 0 Exam by:: K.Niclas Markell,CNM @ 20:46 Pitocin at 4 mU/min  Assessment:  31 yo G1P0 @ 38.1 wks admitted due to ROM Overall reassuring FHRT GBS positive 24-hr ROM; no s/s of chorio  Inadequate MVUs  Plan: Begin Unasyn Continue to increase Pitocin Position change Expect SVD Anxiety meds as ordered  Sherre ScarletWILLIAMS, Kimberly Frey CNM 08/19/2016, 9:13 PM

## 2016-08-19 NOTE — Anesthesia Procedure Notes (Signed)
Epidural Patient location during procedure: OB  Staffing Anesthesiologist: Izzie Geers Performed: anesthesiologist   Preanesthetic Checklist Completed: patient identified, site marked, surgical consent, pre-op evaluation, timeout performed, IV checked, risks and benefits discussed and monitors and equipment checked  Epidural Patient position: sitting Prep: DuraPrep Patient monitoring: heart rate, continuous pulse ox and blood pressure Approach: right paramedian Location: L3-L4 Injection technique: LOR saline  Needle:  Needle type: Tuohy  Needle gauge: 17 G Needle length: 9 cm and 9 Needle insertion depth: 7 cm Catheter type: closed end flexible Catheter size: 20 Guage Catheter at skin depth: 12 cm Test dose: negative  Assessment Events: blood not aspirated, injection not painful, no injection resistance, negative IV test and no paresthesia  Additional Notes Patient identified. Risks/Benefits/Options discussed with patient including but not limited to bleeding, infection, nerve damage, paralysis, failed block, incomplete pain control, headache, blood pressure changes, nausea, vomiting, reactions to medication both or allergic, itching and postpartum back pain. Confirmed with bedside nurse the patient's most recent platelet count. Confirmed with patient that they are not currently taking any anticoagulation, have any bleeding history or any family history of bleeding disorders. Patient expressed understanding and wished to proceed. All questions were answered. Sterile technique was used throughout the entire procedure. Please see nursing notes for vital signs. Test dose was given through epidural needle and negative prior to continuing to dose epidural or start infusion. Warning signs of high block given to the patient including shortness of breath, tingling/numbness in hands, complete motor block, or any concerning symptoms with instructions to call for help. Patient was given  instructions on fall risk and not to get out of bed. All questions and concerns addressed with instructions to call with any issues.     

## 2016-08-19 NOTE — Anesthesia Pain Management Evaluation Note (Signed)
  CRNA Pain Management Visit Note  Patient: Kimberly Frey, 31 y.o., female  "Hello I am a [facility] of the anesthesia [facility]. We have an anesthesia team available at all times to provide care throughout the hospital, including epidural management and anesthesia for C-section. I don't know your plan for the delivery whether it a natural birth, water birth, IV sedation, nitrous supplementation, doula or epidural, but we want to meet your pain goals."   1.Was your pain managed to your expectations on prior hospitalizations?   No prior hospitalizations  2.What is your expectation for pain management during this hospitalization?     Nitrous Oxide  3.How can we help you reach that goal?   Record the patient's initial score and the patient's pain goal.   Pain: 5  Pain Goal: 7 [facility] wants you to be able to say your pain was always managed very well.  Laban EmperorMalinova,Derral Colucci Hristova 08/19/2016

## 2016-08-19 NOTE — Progress Notes (Signed)
LABOR PROGRESS NOTE  Kimberly Frey is a 31 y.o. G1P0 at 7751w1d  admitted for SROM of clear fluid @ 930pm last night (08/18/16)  Subjective: Pt is having irregular contractions but is feeling them. She has had some IV pain medication and tried Nitrous but she did not want to continue the Nitrous. She had planned a natural birth with little intervention but is ok with the pitocin augmentation. She is wanting to move around which is making it difficult to trace the fht's. Explained to pt that we will need to make it a priority to trace the baby. She has changed her cervix. She has thinned her cervix to almost 100%.  Objective: BP 130/80   Pulse 83   Temp 98.5 F (36.9 C) (Oral)   Resp 18   Ht 5\' 3"  (1.6 m)   Wt 254 lb (115.2 kg)   SpO2 97%   BMI 44.99 kg/m  or  Vitals:   08/19/16 0856 08/19/16 0858 08/19/16 0933 08/19/16 0957  BP:  (!) 117/48 131/61 130/80  Pulse:  80 98 83  Resp:      Temp: 98.5 F (36.9 C)     TempSrc: Oral     SpO2:      Weight:      Height:         Dilation: 2 Effacement (%): 90 Cervical Position: Middle Station: -2 Presentation: Vertex Exam by:: Clemmons, CNM  Labs: Lab Results  Component Value Date   WBC 16.0 (H) 08/18/2016   HGB 11.3 (L) 08/18/2016   HCT 34.4 (L) 08/18/2016   MCV 95.6 08/18/2016   PLT 354 08/18/2016    Patient Active Problem List   Diagnosis Date Noted  . Positive GBS test 08/19/2016  . Anxiety disorder 08/19/2016  . Delayed delivery after SROM (spontaneous rupture of membranes) 08/18/2016    Assessment / Plan: 31 y.o. G1P0 at 851w1d here for SROM x 13 hours Obesity in Pregnancy GBS positive  Labor: Early latent Fetal Wellbeing:  Category 1 baseline 115 Pain Control:  Iv pain meds and Nitrous Anticipated MOD:  SVD  Lori A Clemmons CNM 08/19/2016, 10:26 AM

## 2016-08-19 NOTE — Progress Notes (Signed)
LABOR PROGRESS NOTE    Subjective: Pt has anxiety disorder and takes vistaril q 6 hours to calm her. Over the past 2  Hours FHT's have shown marked variability and it is difficult to determine baseline. Reassurred pt and she has made cervical change. She is comfortable with her epidural and her vistaril is due. Addenedum: 524 pm- baby has returned to previous baseline of 120 and has moderate variaiblity, Reassurring  Objective: BP 118/77   Pulse 93   Temp 98.9 F (37.2 C) (Oral)   Resp 18   Ht 5\' 3"  (1.6 m)   Wt 254 lb (115.2 kg)   SpO2 98%   BMI 44.99 kg/m  or  Vitals:   08/19/16 1701 08/19/16 1702 08/19/16 1706 08/19/16 1711  BP:  118/77    Pulse: 91 93 96 93  Resp:      Temp:      TempSrc:      SpO2: 98%  98% 98%  Weight:      Height:         Dilation: 8 Effacement (%): 90 Cervical Position: Middle Station: -1 Presentation: Vertex Exam by:: Jeramia Saleeby, CNM  Labs: Lab Results  Component Value Date   WBC 16.6 (H) 08/19/2016   HGB 11.3 (L) 08/19/2016   HCT 33.7 (L) 08/19/2016   MCV 96.3 08/19/2016   PLT 322 08/19/2016    Patient Active Problem List   Diagnosis Date Noted  . Positive GBS test 08/19/2016  . Anxiety disorder 08/19/2016  . Rh negative, maternal 08/19/2016  . Delayed delivery after SROM (spontaneous rupture of membranes) 08/18/2016    Assessment / Plan: 31 y.o. G1P0 at 603w1d here for SROM  Labor: Stage 1 -Active Fetal Wellbeing:  Cat 1 Pain Control:  epidural Anticipated MOD:  SVD  Dantre Yearwood A Sweta Halseth cnm 08/19/2016, 5:17 PM

## 2016-08-19 NOTE — Progress Notes (Signed)
Serum CR 1.01; all other labs normal. Consulted Dr. Sallye OberKulwa - no action at this time. Will keep a watchful eye on output.  Kimberly Frey, CNM 08/19/16, 6:51 AM

## 2016-08-19 NOTE — Progress Notes (Signed)
LABOR PROGRESS NOTE    Subjective: Comfortable with epidural. Category 1 strip. At times FHT's will have exaggerated variability. Baseline is 115-120.  Objective: BP 119/65   Pulse (!) 131   Temp 98.9 F (37.2 C) (Oral)   Resp 18   Ht 5\' 3"  (1.6 m)   Wt 254 lb (115.2 kg)   SpO2 98%   BMI 44.99 kg/m  or  Vitals:   08/19/16 1802 08/19/16 1806 08/19/16 1815 08/19/16 1820  BP: 119/65     Pulse: 85 85 87 (!) 131  Resp:      Temp:      TempSrc:      SpO2:  92% 94% 98%  Weight:      Height:         Dilation: 8 Effacement (%): 100 Cervical Position: Middle Station: -1 Presentation: Vertex Exam by:: J.Cox, RN @600pm  Labs: Lab Results  Component Value Date   WBC 16.6 (H) 08/19/2016   HGB 11.3 (L) 08/19/2016   HCT 33.7 (L) 08/19/2016   MCV 96.3 08/19/2016   PLT 322 08/19/2016    Patient Active Problem List   Diagnosis Date Noted  . Positive GBS test 08/19/2016  . Anxiety disorder 08/19/2016  . Rh negative, maternal 08/19/2016  . Delayed delivery after SROM (spontaneous rupture of membranes) 08/18/2016    Assessment / Plan: Active labor Pitocin Augmentation GBS Positive Anticipate Vaginal Delivery Lori A Clemmons cnm 08/19/2016, 6:54 PM

## 2016-08-20 ENCOUNTER — Encounter (HOSPITAL_COMMUNITY)
Admission: AD | Disposition: A | Discharge status: Home / Self Care | Payer: Self-pay | Source: Ambulatory Visit | Attending: Obstetrics and Gynecology

## 2016-08-20 ENCOUNTER — Encounter (HOSPITAL_COMMUNITY): Payer: Self-pay

## 2016-08-20 LAB — URINE CULTURE: CULTURE: NO GROWTH

## 2016-08-20 SURGERY — Surgical Case
Anesthesia: Epidural

## 2016-08-20 MED ORDER — CHLOROPROCAINE HCL (PF) 3 % IJ SOLN
INTRAMUSCULAR | Status: DC | PRN
  Administered 2016-08-20: 20 mL

## 2016-08-20 MED ORDER — OXYTOCIN 10 UNIT/ML IJ SOLN
INTRAMUSCULAR | Status: AC
  Filled 2016-08-20: qty 4

## 2016-08-20 MED ORDER — LACTATED RINGERS IV SOLN
INTRAVENOUS | Status: DC
  Administered 2016-08-20: 17:00:00 via INTRAVENOUS

## 2016-08-20 MED ORDER — OXYCODONE HCL 5 MG PO TABS
10.0000 mg | ORAL_TABLET | ORAL | Status: DC | PRN
  Filled 2016-08-20 (×2): qty 2

## 2016-08-20 MED ORDER — SODIUM BICARBONATE 8.4 % IV SOLN
INTRAVENOUS | Status: AC
  Filled 2016-08-20: qty 50

## 2016-08-20 MED ORDER — SCOPOLAMINE 1 MG/3DAYS TD PT72
MEDICATED_PATCH | TRANSDERMAL | Status: AC
  Filled 2016-08-20: qty 1

## 2016-08-20 MED ORDER — CHLOROPROCAINE HCL (PF) 3 % IJ SOLN
INTRAMUSCULAR | Status: DC | PRN
  Administered 2016-08-20: 20 mL via PERINEURAL

## 2016-08-20 MED ORDER — COCONUT OIL OIL: 1.0000 "application " | TOPICAL_OIL | Status: DC | PRN

## 2016-08-20 MED ORDER — DIBUCAINE 1 % RE OINT: 1.0000 "application " | TOPICAL_OINTMENT | RECTAL | Status: DC | PRN

## 2016-08-20 MED ORDER — LACTATED RINGERS IV SOLN
INTRAVENOUS | Status: DC | PRN
  Administered 2016-08-20: 07:00:00 via INTRAVENOUS

## 2016-08-20 MED ORDER — CEFAZOLIN SODIUM-DEXTROSE 2-3 GM-% IV SOLR
INTRAVENOUS | Status: DC | PRN
  Administered 2016-08-20: 2 g via INTRAVENOUS

## 2016-08-20 MED ORDER — BUSPIRONE HCL 10 MG PO TABS
30.0000 mg | ORAL_TABLET | Freq: Every evening | ORAL | Status: DC | PRN
  Administered 2016-08-21 – 2016-08-22 (×3): 30 mg via ORAL
  Filled 2016-08-20 (×4): qty 3

## 2016-08-20 MED ORDER — SCOPOLAMINE 1 MG/3DAYS TD PT72
MEDICATED_PATCH | TRANSDERMAL | Status: DC | PRN
  Administered 2016-08-20: 1 via TRANSDERMAL

## 2016-08-20 MED ORDER — SODIUM CHLORIDE 0.9 % IR SOLN
Status: DC | PRN
  Administered 2016-08-20: 1000 mL

## 2016-08-20 MED ORDER — MEDROXYPROGESTERONE ACETATE 150 MG/ML IM SUSP: 150.0000 mg | INTRAMUSCULAR | Status: DC | PRN

## 2016-08-20 MED ORDER — FENTANYL CITRATE (PF) 100 MCG/2ML IJ SOLN
INTRAMUSCULAR | Status: AC
  Filled 2016-08-20: qty 2

## 2016-08-20 MED ORDER — DIPHENHYDRAMINE HCL 25 MG PO CAPS: 25.0000 mg | ORAL_CAPSULE | Freq: Four times a day (QID) | ORAL | Status: DC | PRN

## 2016-08-20 MED ORDER — HYDROXYZINE HCL 50 MG PO TABS
50.0000 mg | ORAL_TABLET | Freq: Four times a day (QID) | ORAL | Status: DC | PRN
  Administered 2016-08-21 – 2016-08-23 (×6): 50 mg via ORAL
  Filled 2016-08-20 (×9): qty 1

## 2016-08-20 MED ORDER — KETOROLAC TROMETHAMINE 30 MG/ML IJ SOLN
30.0000 mg | Freq: Once | INTRAMUSCULAR | Status: AC
  Administered 2016-08-20: 30 mg via INTRAVENOUS
  Filled 2016-08-20: qty 1

## 2016-08-20 MED ORDER — OXYTOCIN 10 UNIT/ML IJ SOLN
INTRAVENOUS | Status: DC | PRN
  Administered 2016-08-20: 40 [IU] via INTRAVENOUS

## 2016-08-20 MED ORDER — ONDANSETRON HCL 4 MG/2ML IJ SOLN
INTRAMUSCULAR | Status: AC
  Filled 2016-08-20: qty 2

## 2016-08-20 MED ORDER — FLUOXETINE HCL 20 MG PO CAPS
20.0000 mg | ORAL_CAPSULE | Freq: Every evening | ORAL | Status: DC | PRN
  Administered 2016-08-21 – 2016-08-22 (×3): 20 mg via ORAL
  Filled 2016-08-20 (×4): qty 1

## 2016-08-20 MED ORDER — IBUPROFEN 600 MG PO TABS
600.0000 mg | ORAL_TABLET | Freq: Four times a day (QID) | ORAL | Status: DC
  Administered 2016-08-20 – 2016-08-23 (×12): 600 mg via ORAL
  Filled 2016-08-20 (×12): qty 1

## 2016-08-20 MED ORDER — SIMETHICONE 80 MG PO CHEW
80.0000 mg | CHEWABLE_TABLET | ORAL | Status: DC
  Administered 2016-08-20 – 2016-08-22 (×3): 80 mg via ORAL
  Filled 2016-08-20 (×3): qty 1

## 2016-08-20 MED ORDER — HYDROMORPHONE HCL 1 MG/ML IJ SOLN
INTRAMUSCULAR | Status: AC
  Filled 2016-08-20: qty 1

## 2016-08-20 MED ORDER — WITCH HAZEL-GLYCERIN EX PADS: 1.0000 "application " | MEDICATED_PAD | CUTANEOUS | Status: DC | PRN

## 2016-08-20 MED ORDER — MORPHINE SULFATE (PF) 0.5 MG/ML IJ SOLN
INTRAMUSCULAR | Status: AC
  Filled 2016-08-20: qty 10

## 2016-08-20 MED ORDER — MORPHINE SULFATE (PF) 0.5 MG/ML IJ SOLN
INTRAMUSCULAR | Status: DC | PRN
  Administered 2016-08-20: 1 mg via EPIDURAL
  Administered 2016-08-20: 3 mg via EPIDURAL

## 2016-08-20 MED ORDER — ONDANSETRON HCL 4 MG/2ML IJ SOLN
INTRAMUSCULAR | Status: DC | PRN
  Administered 2016-08-20: 4 mg via INTRAVENOUS

## 2016-08-20 MED ORDER — ZOLPIDEM TARTRATE 5 MG PO TABS: 5.0000 mg | ORAL_TABLET | Freq: Every evening | ORAL | Status: DC | PRN

## 2016-08-20 MED ORDER — TETANUS-DIPHTH-ACELL PERTUSSIS 5-2.5-18.5 LF-MCG/0.5 IM SUSP: 0.5000 mL | Freq: Once | INTRAMUSCULAR | Status: DC

## 2016-08-20 MED ORDER — OXYCODONE HCL 5 MG PO TABS
5.0000 mg | ORAL_TABLET | ORAL | Status: DC | PRN
  Administered 2016-08-20 – 2016-08-23 (×13): 5 mg via ORAL
  Filled 2016-08-20 (×11): qty 1

## 2016-08-20 MED ORDER — FAMOTIDINE 20 MG PO TABS: 20.0000 mg | ORAL_TABLET | Freq: Two times a day (BID) | ORAL | Status: DC | PRN

## 2016-08-20 MED ORDER — HYDROMORPHONE HCL 1 MG/ML IJ SOLN
0.5000 mg | Freq: Once | INTRAMUSCULAR | Status: AC
  Administered 2016-08-20: 0.5 mg via INTRAVENOUS

## 2016-08-20 MED ORDER — SIMETHICONE 80 MG PO CHEW
80.0000 mg | CHEWABLE_TABLET | ORAL | Status: DC | PRN
  Filled 2016-08-20: qty 1

## 2016-08-20 MED ORDER — CEFAZOLIN SODIUM-DEXTROSE 2-4 GM/100ML-% IV SOLN
INTRAVENOUS | Status: AC
  Filled 2016-08-20: qty 100

## 2016-08-20 MED ORDER — LACTATED RINGERS IV SOLN
INTRAVENOUS | Status: DC | PRN
  Administered 2016-08-20 (×2): via INTRAVENOUS

## 2016-08-20 MED ORDER — SODIUM BICARBONATE 8.4 % IV SOLN
INTRAVENOUS | Status: DC | PRN
  Administered 2016-08-20: 3 mL via EPIDURAL
  Administered 2016-08-20: 2 mL via EPIDURAL
  Administered 2016-08-20 (×2): 5 mL via EPIDURAL
  Administered 2016-08-20: 3 mL via EPIDURAL
  Administered 2016-08-20: 5 mL via EPIDURAL
  Administered 2016-08-20: 2 mL via EPIDURAL

## 2016-08-20 MED ORDER — SENNOSIDES-DOCUSATE SODIUM 8.6-50 MG PO TABS
2.0000 | ORAL_TABLET | ORAL | Status: DC
  Administered 2016-08-20 – 2016-08-22 (×3): 2 via ORAL
  Filled 2016-08-20 (×3): qty 2

## 2016-08-20 MED ORDER — MENTHOL 3 MG MT LOZG: 1.0000 | LOZENGE | OROMUCOSAL | Status: DC | PRN

## 2016-08-20 MED ORDER — PRENATAL MULTIVITAMIN CH
1.0000 | ORAL_TABLET | Freq: Every day | ORAL | Status: DC
  Administered 2016-08-21 – 2016-08-22 (×2): 1 via ORAL
  Filled 2016-08-20 (×2): qty 1

## 2016-08-20 MED ORDER — FENTANYL CITRATE (PF) 100 MCG/2ML IJ SOLN
INTRAMUSCULAR | Status: DC | PRN
  Administered 2016-08-20: 100 ug via EPIDURAL

## 2016-08-20 MED ORDER — NALBUPHINE HCL 10 MG/ML IJ SOLN
5.0000 mg | INTRAMUSCULAR | Status: DC | PRN
  Administered 2016-08-20: 5 mg via SUBCUTANEOUS
  Filled 2016-08-20: qty 1

## 2016-08-20 MED ORDER — OXYTOCIN 40 UNITS IN LACTATED RINGERS INFUSION - SIMPLE MED: 2.5000 [IU]/h | INTRAVENOUS | Status: AC

## 2016-08-20 MED ORDER — LIDOCAINE-EPINEPHRINE (PF) 2 %-1:200000 IJ SOLN
INTRAMUSCULAR | Status: AC
  Filled 2016-08-20: qty 20

## 2016-08-20 MED ORDER — SIMETHICONE 80 MG PO CHEW
80.0000 mg | CHEWABLE_TABLET | Freq: Three times a day (TID) | ORAL | Status: DC
  Administered 2016-08-20 – 2016-08-23 (×8): 80 mg via ORAL
  Filled 2016-08-20 (×8): qty 1

## 2016-08-20 SURGICAL SUPPLY — 38 items
BENZOIN TINCTURE PRP APPL 2/3 (GAUZE/BANDAGES/DRESSINGS) ×2 IMPLANT
CHLORAPREP W/TINT 26ML (MISCELLANEOUS) ×2 IMPLANT
CLAMP CORD UMBIL (MISCELLANEOUS) ×2 IMPLANT
CLOSURE STERI STRIP 1/2 X4 (GAUZE/BANDAGES/DRESSINGS) ×2 IMPLANT
CLOTH BEACON ORANGE TIMEOUT ST (SAFETY) ×2 IMPLANT
CONTAINER PREFILL 10% NBF 15ML (MISCELLANEOUS) IMPLANT
DRAPE C SECTION CLR SCREEN (DRAPES) ×2 IMPLANT
DRSG OPSITE POSTOP 4X10 (GAUZE/BANDAGES/DRESSINGS) ×2 IMPLANT
ELECT REM PT RETURN 9FT ADLT (ELECTROSURGICAL) ×2
ELECTRODE REM PT RTRN 9FT ADLT (ELECTROSURGICAL) ×1 IMPLANT
EXTRACTOR VACUUM M CUP 4 TUBE (SUCTIONS) IMPLANT
GLOVE BIO SURGEON STRL SZ7.5 (GLOVE) ×6 IMPLANT
GLOVE BIOGEL PI IND STRL 7.0 (GLOVE) ×1 IMPLANT
GLOVE BIOGEL PI IND STRL 7.5 (GLOVE) ×3 IMPLANT
GLOVE BIOGEL PI INDICATOR 7.0 (GLOVE) ×1
GLOVE BIOGEL PI INDICATOR 7.5 (GLOVE) ×3
GOWN STRL REUS W/TWL LRG LVL3 (GOWN DISPOSABLE) ×6 IMPLANT
HEMOSTAT SURGICEL 2X14 (HEMOSTASIS) ×2 IMPLANT
KIT ABG SYR 3ML LUER SLIP (SYRINGE) ×4 IMPLANT
NEEDLE HYPO 25X5/8 SAFETYGLIDE (NEEDLE) IMPLANT
NS IRRIG 1000ML POUR BTL (IV SOLUTION) ×2 IMPLANT
PACK C SECTION WH (CUSTOM PROCEDURE TRAY) ×2 IMPLANT
PAD OB MATERNITY 4.3X12.25 (PERSONAL CARE ITEMS) ×2 IMPLANT
PENCIL SMOKE EVAC W/HOLSTER (ELECTROSURGICAL) ×2 IMPLANT
RETAINER VISCERAL (MISCELLANEOUS) ×2 IMPLANT
RTRCTR C-SECT PINK 25CM LRG (MISCELLANEOUS) ×2 IMPLANT
SPONGE LAP 18X18 X RAY DECT (DISPOSABLE) ×2 IMPLANT
STRIP CLOSURE SKIN 1/2X4 (GAUZE/BANDAGES/DRESSINGS) ×2 IMPLANT
SUT CHROMIC 2 0 CT 1 (SUTURE) ×2 IMPLANT
SUT MNCRL AB 3-0 PS2 27 (SUTURE) ×2 IMPLANT
SUT PLAIN 2 0 XLH (SUTURE) ×2 IMPLANT
SUT VIC AB 0 CT1 36 (SUTURE) ×2 IMPLANT
SUT VIC AB 0 CTX 36 (SUTURE) ×3
SUT VIC AB 0 CTX36XBRD ANBCTRL (SUTURE) ×3 IMPLANT
SUT VIC AB 2-0 SH 27 (SUTURE) ×6
SUT VIC AB 2-0 SH 27XBRD (SUTURE) ×6 IMPLANT
TOWEL OR 17X24 6PK STRL BLUE (TOWEL DISPOSABLE) ×2 IMPLANT
TRAY FOLEY BAG SILVER LF 14FR (SET/KITS/TRAYS/PACK) ×2 IMPLANT

## 2016-08-20 NOTE — Op Note (Addendum)
Cesarean Section Procedure Note  Indications: P0 at 39 2/7wks admitted with PROM undergoing augmentation with failure to descend and report of recurrent decels with pushing.    Pre-operative Diagnosis: PRIMARY LOW TRANSVERSE C-SECTION   Post-operative Diagnosis: PRIMARY LOW TRANSVERSE C-SECTION  Procedure: PRIMARY LOW TRANSVERSE CESAREAN SECTION  Surgeon: Osborn Coho, MD  Assistants: Sherre Scarlet, CNM then Illene Bolus, CNM at change of shift  Anesthesia: Epidural  Procedure Details  The patient was taken to the operating room secondary to failure to descend after the risks, benefits, complications, treatment options, and expected outcomes were discussed with the patient.  The patient concurred with the proposed plan, giving informed consent which was signed and witnessed. The patient was taken to Operating Room C-Section Suite, identified as Kimberly Frey and the procedure verified as C-Section Delivery. A Time Out was held and the above information confirmed.  After induction of anesthesia by obtaining a spinal, the patient was prepped and draped in the usual sterile manner. A Pfannenstiel skin incision was made and carried down through the subcutaneous tissue to the underlying layer of fascia.  The fascia was incised bilaterally and extended transversely bilaterally with the Mayo scissors. Kocher clamps were placed on the inferior aspect of the fascial incision and the underlying rectus muscle was separated from the fascia. The same was done on the superior aspect of the fascial incision.  The peritoneum was identified, entered bluntly and extended manually.  An Alexis self-retaining retractor was placed.  The utero-vesical peritoneal reflection was incised transversely and the bladder flap was bluntly freed from the lower uterine segment. A low transverse uterine incision was made with the scalpel and extended bilaterally with the bandage scissors.  The infant was delivered in  vertex position without difficulty.  After the umbilical cord was clamped and cut, the infant was handed to the awaiting pediatricians.  Cord blood was obtained for evaluation.  The placenta was removed intact and appeared to be within normal limits. The uterus was cleared of all clots and debris. Bilateral uterine extensions were noted.  The left uterine extrension traversed the uterine artery and there was bleeding at this area.  A running interlocking stitch was placed.  The same was done on the contralateral side.  The uterine incision was closed with running interlocking sutures of 0 Vicryl.  An imbricating stitch of 2-0 vicryl was placed at the left extension.  Some bleeding was noted at the distal end of the extension where the uterine artery was located.  The artery was ligated.  A second imbricating layer was performed as well on the primary uterine incision.   Bilateral tubes and ovaries appeared to be within normal limits.  Good hemostasis was noted.  Copious irrigation was performed until clear.  Surgicel was placed behind the bladder at each extension to aid hemostasis.  The peritoneum was repaired with 2-0 chromic via a running suture after placing nesacaine in the intraabdominal cavity as the patient was becoming uncomfortable.  The fascia was reapproximated with a running suture of 0 Vicryl. The subcutaneous tissue was reapproximated with 3 interrupted sutures of 2-0 plain.  The skin was reapproximated with a subcuticular suture of 3-0 monocryl.  Steristrips were applied.  Instrument, sponge, and needle counts were correct prior to abdominal closure and at the conclusion of the case.  The patient was awaiting transfer to the recovery room in good condition.  Findings: Live female infant with Apgars 8 at one minute and 9 at five minutes.  Normal  appearing bilateral ovaries and fallopian tubes were noted.  Estimated Blood Loss:  600 ml         Drains: foley to gravity 500 cc         Total IV  Fluids: 1700 ml         Specimens to Pathology: Placenta         Complications:  None; patient tolerated the procedure well.         Disposition: PACU - hemodynamically stable.         Condition: stable  Attending Attestation: I performed the procedure.

## 2016-08-20 NOTE — Progress Notes (Signed)
Call received from RN around 11:58 PM that pt was C/C/+1 and pushing was about to begin.  Subjective:   Objective: BP 123/77   Pulse 96   Temp 99.3 F (37.4 C) (Oral)   Resp 18   Ht 5\' 3"  (1.6 m)   Wt 115.2 kg (254 lb)   SpO2 98%   BMI 44.99 kg/m  I/O last 3 completed shifts: In: 3364.3 [P.O.:450; I.V.:2764.3; IV Piggyback:150] Out: 2000 [Urine:1750; Emesis/NG output:250] Today's Vitals   08/19/16 2300 08/19/16 2315 08/19/16 2330 08/19/16 2343  BP: 125/71  123/77   Pulse: 91 87 96   Resp: 18     Temp: 99 F (37.2 C)   99.3 F (37.4 C)  TempSrc: Oral   Oral  SpO2:      Weight:      Height:      PainSc:       FHT: Marked - baseline down into the 90s with pushing, earlys and variables noted UC:   irregular, every 2-3 minutes SVE:   Dilation: 10 Effacement (%): 100 Station: +1 Exam by:: J.Follmer,RNC @ 23:30 Pitocin at 4 mU/min  Assessment:  IUP at 38.2 wks Increased anxiety SROM x 27+ hrs; no s/s of infection GBS positive Overall reassuring FHRT   Plan: Pt pushed x 1 hr, but not effective. She reports no urge to push, just feels pressure. Recommended passive descent x 2 hrs and encouraged pt to relax and nap. Will monitor FHR closely. Continue antibiotics.  Sherre ScarletWILLIAMS, Alieah Brinton CNM 08/20/2016, 12:33 AM

## 2016-08-20 NOTE — Anesthesia Postprocedure Evaluation (Signed)
Anesthesia Post Note  Patient: Kimberly Frey  Procedure(s) Performed: Procedure(s) (LRB): CESAREAN SECTION (N/A)  Patient location during evaluation: Mother Baby Anesthesia Type: Epidural Level of consciousness: awake Pain management: pain level controlled Vital Signs Assessment: post-procedure vital signs reviewed and stable Respiratory status: spontaneous breathing Cardiovascular status: stable Postop Assessment: no headache, no backache, epidural receding, patient able to bend at knees, no signs of nausea or vomiting and adequate PO intake Anesthetic complications: no        Last Vitals:  Vitals:   08/20/16 1100 08/20/16 1200  BP: (!) 101/44 (!) 96/46  Pulse: 84 84  Resp: 20 18  Temp: 37 C 37.2 C    Last Pain:  Vitals:   08/20/16 1210  TempSrc:   PainSc: 5    Pain Goal:                 Kimberly Frey

## 2016-08-20 NOTE — Lactation Note (Signed)
This note was copied from a baby's chart. Lactation Consultation Note: Lactation brochure given to mother with basic teaching done. Mother reports that infant had a good feeding in PACU for 45 mins. Mother reported that she felt strong tugging. Assist mother with hand expression . Observed drops from both breast. Mother has flat nipples . She was taught to firm nipple before attempting to latch infant. Infant just has his first bath and was very sleepy when attempt to breastfeed. Mother was made aware of available tools to use if unable to get infant latched. Mother to put her bra on and wear shells. She was given a hand pump to aid in firming nipple. Discussed the use of a nipple shield if infant unable to sustain latch. Mother advised to breastfeed infant 8-12 times in 24 hours. Discussed cue base feeding and cluster feeding. Mother and father receptive to all teaching.   Patient Name: Boy Solon Augustaatricia Traber ZOXWR'UToday's Date: 08/20/2016 Reason for consult: Initial assessment   Maternal Data    Feeding Feeding Type: Breast Fed Length of feed: 5 min  LATCH Score/Interventions                      Lactation Tools Discussed/Used     Consult Status      Michel BickersKendrick, Daphanie Oquendo McCoy 08/20/2016, 4:02 PM

## 2016-08-20 NOTE — Progress Notes (Signed)
[facility]  Delivery Note:  C-section       08/20/2016  6:30 AM  I was called to the operating room at the request of the patient's obstetrician (Dr. Su Hiltoberts) for a primary c-section due to failure to progress.  PRENATAL HX:  This is a 31 y/o G1P0 at 2538 and 2/[redacted] weeks gestation who was admitted on 5/8 for SROM (ROM ~ 31 hours).  Her pregnancy is complicated by anxiety and she is on Buspar, Prozac, and Atarax.  She is GBS positive and received adequate treatment and she has been afebrile. C-section for failure to progress.    DELIVERY:  Infant was vigorous at delivery, requiring no resuscitation other than standard warming, drying and stimulation.  APGARs 8 and 9.  Exam notable for molding but was otherwise within normal limits.  After 5 minutes, baby left with nurse to assist parents with skin-to-skin care.   _____________________ Electronically Signed By: Maryan CharLindsey Haelie Clapp, MD Neonatologist

## 2016-08-20 NOTE — Transfer of Care (Signed)
Immediate Anesthesia Transfer of Care Note  Patient: Kimberly Frey  Procedure(s) Performed: Procedure(s): CESAREAN SECTION (N/A)  Patient Location: PACU  Anesthesia Type:Epidural  Level of Consciousness: awake  Airway & Oxygen Therapy: Patient Spontanous Breathing  Post-op Assessment: Report given to RN  Post vital signs: Reviewed  Last Vitals:  Vitals:   08/20/16 0515 08/20/16 0530  BP:  116/65  Pulse: 87 90  Resp:  18  Temp:      Last Pain:  Vitals:   08/19/16 2343  TempSrc: Oral  PainSc:          Complications: No apparent anesthesia complications

## 2016-08-20 NOTE — Progress Notes (Addendum)
Pushing resumed. Decels to 90s w/ pushing despite intrauterine resuscitative measures. Caput +1, skull at 0 to -1. Discussed findings w/ pt and recommended c-section. Couple in agreement. Dr. Su Hiltoberts contacted re: findings and pt's decision for c-section. Pitocin off, overall reassuring FHRT currently. Risks, Benefits, Alternatives including but not limited to bleeding, infection and injury were discussed with the patient. Risk of transfusion also discussed. Pt consents to blood in the event of an emergency.     Sherre ScarletKimberly Aviva Wolfer, CNM 08/20/16, 3:30 AM

## 2016-08-21 DIAGNOSIS — Z98891 History of uterine scar from previous surgery: Secondary | ICD-10-CM

## 2016-08-21 LAB — COMPREHENSIVE METABOLIC PANEL
ALT: 12 U/L — ABNORMAL LOW (ref 14–54)
AST: 18 U/L (ref 15–41)
Albumin: 2.1 g/dL — ABNORMAL LOW (ref 3.5–5.0)
Alkaline Phosphatase: 99 U/L (ref 38–126)
Anion gap: 8 (ref 5–15)
BILIRUBIN TOTAL: 0.1 mg/dL — AB (ref 0.3–1.2)
BUN: 10 mg/dL (ref 6–20)
CO2: 23 mmol/L (ref 22–32)
Calcium: 8.5 mg/dL — ABNORMAL LOW (ref 8.9–10.3)
Chloride: 104 mmol/L (ref 101–111)
Creatinine, Ser: 0.87 mg/dL (ref 0.44–1.00)
Glucose, Bld: 90 mg/dL (ref 65–99)
POTASSIUM: 3.8 mmol/L (ref 3.5–5.1)
Sodium: 135 mmol/L (ref 135–145)
TOTAL PROTEIN: 5.5 g/dL — AB (ref 6.5–8.1)

## 2016-08-21 LAB — KLEIHAUER-BETKE STAIN
# Vials RhIg: 1
FETAL CELLS %: 0 %
QUANTITATION FETAL HEMOGLOBIN: 0 mL

## 2016-08-21 LAB — CBC
HCT: 26.7 % — ABNORMAL LOW (ref 36.0–46.0)
HEMOGLOBIN: 8.9 g/dL — AB (ref 12.0–15.0)
MCH: 32 pg (ref 26.0–34.0)
MCHC: 33.3 g/dL (ref 30.0–36.0)
MCV: 96 fL (ref 78.0–100.0)
Platelets: 271 10*3/uL (ref 150–400)
RBC: 2.78 MIL/uL — AB (ref 3.87–5.11)
RDW: 15.5 % (ref 11.5–15.5)
WBC: 18.4 10*3/uL — ABNORMAL HIGH (ref 4.0–10.5)

## 2016-08-21 LAB — CREATININE, SERUM
CREATININE: 0.92 mg/dL (ref 0.44–1.00)
GFR calc Af Amer: >60 mL/min (ref >60)
GFR calc non Af Amer: >60 mL/min (ref >60)

## 2016-08-21 NOTE — Progress Notes (Signed)
MOB was referred for history of depression/anxiety. * Referral screened out by Clinical Social Worker because none of the following criteria appear to apply: ~ History of anxiety/depression during this pregnancy, or of post-partum depression. ~ Diagnosis of anxiety and/or depression within last 3 years OR * MOB's symptoms currently being treated with medication and/or therapy. Please contact the Clinical Social Worker if needs arise, or if MOB requests.   

## 2016-08-21 NOTE — Lactation Note (Signed)
This note was copied from a baby's chart. Lactation Consultation Note  Patient Name: Kimberly Frey VHQIO'NToday's Date: 08/21/2016 Reason for consult: Follow-up assessment;Difficult latch;Infant < 6lbs  Follow up visit at 35 hours.  Mom wants to wake baby and attempt feeding with LC.  MOm using shells and hand pump to help evert nipples, few mls collected when hand pumping.  No visible colostrum with hand expression.  Breast tissue minimally compressible and nipple flattens.   Lc assisted with STS in football hold on right breast.  Parents allow shallow latching and sucking up onto nipple with FOB doing chin tug.  Baby then had hiccups for several minutes.  LC demonstrated waking techniques.  Baby very sleepy and not sucking with stimulation.  LC fit mom with #24 NS, baby latched well with strong sucks when stimulated.  LC assisted with syringe feeding while latched with 2mls EBM.  Baby sucked with audible and visible swallowing during supplement.  Baby again sleepy at breast.  MOm is able to return demonstration of NS application.   LC set up DEBP pump(#24 flange good fit at this time)  for post pumping after feedings and to offer supplementing with each feeding.  FOB at bedside supportive. Mom to call for assist as needed.       Maternal Data Has patient been taught Hand Expression?: Yes  Feeding Feeding Type: Breast Fed Length of feed: 10 min  LATCH Score/Interventions Latch: Repeated attempts needed to sustain latch, nipple held in mouth throughout feeding, stimulation needed to elicit sucking reflex. Intervention(s): Adjust position;Assist with latch;Breast massage;Breast compression  Audible Swallowing: A few with stimulation Intervention(s): Skin to skin;Hand expression;Alternate breast massage  Type of Nipple: Flat Intervention(s):  (flattens with compression not compressible)  Comfort (Breast/Nipple): Soft / non-tender     Hold (Positioning): Assistance needed to correctly  position infant at breast and maintain latch. Intervention(s): Breastfeeding basics reviewed;Support Pillows;Position options;Skin to skin  LATCH Score: 6  Lactation Tools Discussed/Used Tools: Nipple Shields Nipple shield size: 24 WIC Program: Yes Pump Review: Setup, frequency, and cleaning;Milk Storage Initiated by:: JS IBCLC Date initiated:: 08/21/16   Consult Status Consult Status: Follow-up Date: 08/22/16 Follow-up type: In-patient    Shoptaw, [name] 08/21/2016, 7:23 PM

## 2016-08-21 NOTE — Lactation Note (Signed)
This note was copied from a baby's chart. Lactation Consultation Note  Patient Name: Boy Solon Augustaatricia Tomeo ZOXWR'UToday's Date: 08/21/2016 Reason for consult: Follow-up assessment  Follow up visit at 34 hours of age.  Baby is 34 hours at 5#11oz with weight loss of 4% at 24 hours of age.  Baby has had 7 feedings with 4 voids and 3 stools, LATCH scores of "7." Mom denies concerns, but would like LC to observe next feeding, mom to call. Mom reports recent feeding of 10 minutes at each breast with baby more awake and active,but still sleepy.   LC advised mom to continue to work on hand expression before feeding, compressing during feedings and to collect after feedings to offer baby extra EBM by spoon.   Maternal Data Has patient been taught Hand Expression?: Yes  Feeding Feeding Type: Breast Fed Length of feed: 10 min  LATCH Score/Interventions                Intervention(s): Breastfeeding basics reviewed     Lactation Tools Discussed/Used     Consult Status Consult Status: Follow-up Date: 08/22/16 Follow-up type: In-patient    Chrles Selley, [name] 08/21/2016, 5:18 PM

## 2016-08-21 NOTE — Progress Notes (Signed)
Subjective: Postop Day 1: Cesarean Delivery No complaints.  Pain controlled.  Lochia normal.  Breast feeding yes. Pt has not ambulated in halls yet.  Objective: Temp:  [97.6 F (36.4 C)-98.6 F (37 C)] 98.2 F (36.8 C) (05/21 0538) Pulse Rate:  [72-85] 72 (05/21 0538) Resp:  [18-20] 18 (05/21 0538) BP: (97-127)/(49-69) 118/63 (05/21 0538) SpO2:  [95 %-96 %] 96 % (05/21 0538)  Physical Exam: Gen: NAD Lochia: Not visualized Uterine Fundus: firm, appropriately tender Incision: clean, dry and intact, healing well DVT Evaluation: 3+ Edema present, no calf tenderness bilaterally    Recent Labs  08/19/16 1109 08/21/16 0529  HGB 11.3* 8.9*  HCT 33.7* 26.7*    Assessment/Plan: Status post C-section-doing well postoperatively. Prolonged ROM- Afebrile, no s/sxs of infection. Encouraged ambulation in halls TID. Declines circumcision. H/o anxiety doing well.  Pt encouraged to call her psychiatrist. Continue routine postop/postpartum care.    Geryl RankinsVARNADO, Kimberly Frey 08/21/2016, 1:22 PM

## 2016-08-22 MED ORDER — RHO D IMMUNE GLOBULIN 1500 UNIT/2ML IJ SOSY
300.0000 ug | PREFILLED_SYRINGE | Freq: Once | INTRAMUSCULAR | Status: AC
  Administered 2016-08-22: 300 ug via INTRAMUSCULAR
  Filled 2016-08-22: qty 2

## 2016-08-22 NOTE — Lactation Note (Signed)
This note was copied from a baby's chart. Lactation Consultation Note  Baby 2058 hours old.  < 6 lbs. Sleeping. Mother's breasts are filling.  She wanted help practicing hand expression. Reviewed hand expression and drops expressed. Mother is going to pump while baby sleeping. Discussed bf on both breasts and waking baby if needed.  Encouraged depth and compression during feeding. Encouraged mother to post pump 4-5 times a day and give volume back to baby.    Patient Name: Kimberly Frey Augustaatricia Yeaman VHQIO'NToday's Date: 08/22/2016 Reason for consult: Follow-up assessment   Maternal Data Has patient been taught Hand Expression?: Yes  Feeding Feeding Type: Breast Fed Length of feed: 15 min  LATCH Score/Interventions                      Lactation Tools Discussed/Used     Consult Status Consult Status: Follow-up Date: 08/23/16 Follow-up type: In-patient    Dahlia ByesBerkelhammer, Ruth [facility] Inc[facility]oschen 08/22/2016, 5:05 PM

## 2016-08-22 NOTE — Progress Notes (Signed)
Subjective: Postop Day 1: Cesarean Delivery No complaints.  Pain controlled.  Lochia normal.  Breast feeding yes. Pt ambulated in halls which helped.  Mood is good.  Objective: Temp:  [97.8 F (36.6 C)-99 F (37.2 C)] 97.8 F (36.6 C) (05/22 0540) Pulse Rate:  [81-83] 81 (05/22 0540) Resp:  [18] 18 (05/22 0540) BP: (122-132)/(59-66) 132/66 (05/22 0540) SpO2:  [95 %] 95 % (05/22 0540)  Physical Exam: Gen: NAD Lochia: Not visualized Uterine Fundus: firm, appropriately tender Incision: clean, dry and intact, healing well DVT Evaluation: 3+ Edema present, no calf tenderness bilaterally    Recent Labs  08/21/16 0529  HGB 8.9*  HCT 26.7*    Assessment/Plan: Status post C-section-doing well postoperatively. Prolonged ROM- Afebrile, no s/sxs of infection. Encouraged ambulation in halls TID. Declines circumcision. H/o anxiety doing well.  . Continue routine postop/postpartum care.  Discharge in am.  Dr. Charlotta Newtonzan covering until 7pm today.  CCOB 7pm to 7am.    Kimberly Frey 08/22/2016, 2:23 PM

## 2016-08-23 LAB — RH IG WORKUP (INCLUDES ABO/RH)
ABO/RH(D): O NEG
Gestational Age(Wks): 38
UNIT DIVISION: 0

## 2016-08-23 MED ORDER — IBUPROFEN 600 MG PO TABS: 600.0000 mg | ORAL_TABLET | Freq: Four times a day (QID) | ORAL | 1 refills | Status: AC

## 2016-08-23 MED ORDER — OXYCODONE-ACETAMINOPHEN 5-325 MG PO TABS: 1.0000 | ORAL_TABLET | ORAL | 0 refills | Status: AC | PRN

## 2016-08-23 NOTE — Discharge Summary (Signed)
OB Discharge Summary     Patient Name: Kimberly Frey DOB: 1985/06/19 MRN: [id number]  Date of admission: 08/18/2016 Delivering MD: Osborn CohoOBERTS, ANGELA   Date of discharge: 08/23/2016  Admitting diagnosis: water broke 38 wks Intrauterine pregnancy: 4220w2d     Secondary diagnosis:  Principal Problem:   S/P primary low transverse C-section Active Problems:   Positive GBS test   Anxiety disorder   Rh negative, maternal  Additional problems: Anxiety, Depression     Discharge diagnosis: Term Pregnancy Delivered                                                                                                Post partum procedures:none  Augmentation: Pitocin  Complications: None  Hospital course:  Onset of Labor With Unplanned C/S  31 y.o. yo G1P1001 at 7320w2d was admitted in Latent Labor on 08/18/2016. Patient had a labor course significant for fet. Membrane Rupture Time/Date: 9:15 PM ,08/18/2016   The patient went for cesarean section due to Arrest of Descent and recurrent decelerations, and delivered a Viable infant,08/20/2016  Details of operation can be found in separate operative note. Patient had an uncomplicated postpartum course.  She is ambulating,tolerating a regular diet, passing flatus, and urinating well.  Patient is discharged home in stable condition 08/23/16.  Physical exam  Vitals:   08/21/16 1936 08/22/16 0540 08/22/16 1700 08/23/16 0545  BP: (!) 122/59 132/66 138/65 121/67  Pulse: 83 81 89 70  Resp: 18 18 18    Temp: 99 F (37.2 C) 97.8 F (36.6 C) 98.4 F (36.9 C) 98 F (36.7 C)  TempSrc: Oral Oral Oral Oral  SpO2:  95% 95%   Weight:      Height:       General: alert, cooperative and no distress Lochia: appropriate Uterine Fundus: firm Incision: Dressing is clean, dry, and intact DVT Evaluation: No evidence of DVT seen on physical exam. Calf/Ankle edema is present Labs: Lab Results  Component Value Date   WBC 18.4 (H) 08/21/2016   HGB 8.9 (L)  08/21/2016   HCT 26.7 (L) 08/21/2016   MCV 96.0 08/21/2016   PLT 271 08/21/2016   CMP Latest Ref Rng & Units 08/21/2016  Glucose 65 - 99 mg/dL 90  BUN 6 - 20 mg/dL 10  Creatinine 4.090.44 - 8.111.00 mg/dL 9.140.87  Sodium 782135 - 956145 mmol/L 135  Potassium 3.5 - 5.1 mmol/L 3.8  Chloride 101 - 111 mmol/L 104  CO2 22 - 32 mmol/L 23  Calcium 8.9 - 10.3 mg/dL 2.1(H8.5(L)  Total Protein 6.5 - 8.1 g/dL 0.8(M5.5(L)  Total Bilirubin 0.3 - 1.2 mg/dL 5.7(Q0.1(L)  Alkaline Phos 38 - 126 U/L 99  AST 15 - 41 U/L 18  ALT 14 - 54 U/L 12(L)    Discharge instruction: per After Visit Summary and "Baby and Me Booklet".  After visit meds:  Allergies as of 08/23/2016      Reactions   Hops Oil Anaphylaxis   Bactrim [sulfamethoxazole-trimethoprim] Nausea And Vomiting, Other (See Comments)   Reaction:  Dizziness and weakness    Sulfa Antibiotics Nausea And Vomiting, Other (See  Comments)   Reaction:  Dizziness and weakness       Medication List    TAKE these medications   busPIRone 30 MG tablet Commonly known as:  BUSPAR Take 30 mg by mouth at bedtime.   diphenhydramine-acetaminophen 25-500 MG Tabs tablet Commonly known as:  TYLENOL PM Take 1 tablet by mouth 2 (two) times daily as needed (for pain/sleep).   famotidine 20 MG tablet Commonly known as:  PEPCID Take 20 mg by mouth 2 (two) times daily as needed for heartburn or indigestion.   FLUoxetine 20 MG capsule Commonly known as:  PROZAC Take 20 mg by mouth at bedtime.   hydrOXYzine 50 MG tablet Commonly known as:  ATARAX/VISTARIL Take 50 mg by mouth every 6 (six) hours as needed for anxiety.   ibuprofen 600 MG tablet Commonly known as:  ADVIL,MOTRIN Take 1 tablet (600 mg total) by mouth every 6 (six) hours.   oxyCODONE-acetaminophen 5-325 MG tablet Commonly known as:  PERCOCET/ROXICET Take 1-2 tablets by mouth every 4 (four) hours as needed for severe pain.   polyethylene glycol packet Commonly known as:  MIRALAX / GLYCOLAX Take 17 g by mouth at  bedtime.   prenatal multivitamin Tabs tablet Take 1 tablet by mouth at bedtime.       Diet: routine diet  Activity: Advance as tolerated. Pelvic rest for 6 weeks.   Outpatient follow up:2 weeks Follow up Appt:No future appointments. Follow up Visit:No Follow-up on file.  Postpartum contraception: Undecided  Newborn Data: Live born female  Birth Weight: 5 lb 15.1 oz (2695 g) APGAR: 8, 9  Baby Feeding: Breast Disposition:home with mother Circumcision was not done.  08/23/2016 Geryl Rankins, MD

## 2016-08-23 NOTE — Lactation Note (Signed)
This note was copied from a baby's chart. Lactation Consultation Note  Patient Name: Kimberly Frey ZOXWR'UToday's Date: 08/23/2016 Reason for consult: Follow-up assessment  Baby 74 hours old. Mom reports that she is using NS and she is hearing swallows at the breast. Discussed methods of moving away from use of shield, the need for additional pumping/stimulation of breast while using NS, and mom aware of OP/BFSG and LC phone line assistance after D/C. Discussed the need to nurse baby with cues and at least every 3 hours since baby less than 6 pounds. Discussed milk coming to volume and engorgement prevention/treatment.   Maternal Data    Feeding Feeding Type: Breast Fed  LATCH Score/Interventions                      Lactation Tools Discussed/Used     Consult Status Consult Status: PRN    Sherlyn HayJennifer D Ina Scrivens 08/23/2016, 9:07 AM

## 2016-08-23 NOTE — Discharge Instructions (Signed)

## 2016-10-12 NOTE — Anesthesia Postprocedure Evaluation (Signed)
Anesthesia Post Note  Patient: Kimberly Frey  Procedure(s) Performed: Procedure(s) (LRB): CESAREAN SECTION (N/A)     Anesthesia Post Evaluation  Last Vitals:  Vitals:   08/22/16 1700 08/23/16 0545  BP: 138/65 121/67  Pulse: 89 70  Resp: 18   Temp: 36.9 C 36.7 C    Last Pain:  Vitals:   08/23/16 1116  TempSrc:   PainSc: 6                  Phillips Groutarignan, Ma Munoz

## 2016-10-12 NOTE — Addendum Note (Signed)
Addendum  created 10/12/16 1441 by Phillips Groutarignan, Tammy Ericsson, MD   Sign clinical note

## 2017-03-30 ENCOUNTER — Telehealth (HOSPITAL_COMMUNITY): Payer: Self-pay | Admitting: Lactation Services

## 2017-03-30 NOTE — Telephone Encounter (Signed)
Return lactation Note:  Patient called with concerns of milk supply decreasing over the last week.  Per  Mom has been exclusively breast feeding/ and occasional bottles of EBM, and solids  Started at 6 1/2 months.  Last week has been hectic and did not get a chance to pump for 2 days and when did pump got less than 1 oz.  Baby was using a Nipple Yield to latch until this week and gave it up and now latches without . He is also teething  And breast feeding a lot, and one solid meal a day.   LC recommended rest, naps, plenty of fluids, especially water, and calories, post pump both breast for 10 -15 mins  After several feedings when the baby isn't cluster feeding.  Add Mother love plus tea and Mother love plus supplement herbs/ take as directed.  Google low milk supply for receipt for lactation cookies.   Call back if the above plan isn't working.

## 2017-07-09 DIAGNOSIS — F411 Generalized anxiety disorder: Secondary | ICD-10-CM | POA: Diagnosis not present

## 2017-07-09 DIAGNOSIS — F410 Panic disorder [episodic paroxysmal anxiety] without agoraphobia: Secondary | ICD-10-CM | POA: Diagnosis not present

## 2017-10-22 DIAGNOSIS — N751 Abscess of Bartholin's gland: Secondary | ICD-10-CM | POA: Diagnosis not present

## 2017-10-31 DIAGNOSIS — N751 Abscess of Bartholin's gland: Secondary | ICD-10-CM | POA: Diagnosis not present

## 2018-02-23 DIAGNOSIS — F410 Panic disorder [episodic paroxysmal anxiety] without agoraphobia: Secondary | ICD-10-CM | POA: Diagnosis not present

## 2018-02-23 DIAGNOSIS — F411 Generalized anxiety disorder: Secondary | ICD-10-CM | POA: Diagnosis not present

## 2018-03-23 DIAGNOSIS — X58XXXA Exposure to other specified factors, initial encounter: Secondary | ICD-10-CM | POA: Diagnosis not present

## 2018-03-23 DIAGNOSIS — Z882 Allergy status to sulfonamides status: Secondary | ICD-10-CM | POA: Diagnosis not present

## 2018-03-23 DIAGNOSIS — R6884 Jaw pain: Secondary | ICD-10-CM | POA: Diagnosis not present

## 2018-03-23 DIAGNOSIS — K047 Periapical abscess without sinus: Secondary | ICD-10-CM | POA: Diagnosis not present

## 2018-03-23 DIAGNOSIS — S025XXA Fracture of tooth (traumatic), initial encounter for closed fracture: Secondary | ICD-10-CM | POA: Diagnosis not present

## 2018-05-14 DIAGNOSIS — N3289 Other specified disorders of bladder: Secondary | ICD-10-CM | POA: Diagnosis not present

## 2018-05-20 DIAGNOSIS — Z8659 Personal history of other mental and behavioral disorders: Secondary | ICD-10-CM | POA: Diagnosis not present

## 2018-05-20 DIAGNOSIS — Z01419 Encounter for gynecological examination (general) (routine) without abnormal findings: Secondary | ICD-10-CM | POA: Diagnosis not present

## 2018-05-20 DIAGNOSIS — N898 Other specified noninflammatory disorders of vagina: Secondary | ICD-10-CM | POA: Diagnosis not present

## 2018-05-20 DIAGNOSIS — Z6841 Body Mass Index (BMI) 40.0 and over, adult: Secondary | ICD-10-CM | POA: Diagnosis not present

## 2018-05-20 DIAGNOSIS — Z124 Encounter for screening for malignant neoplasm of cervix: Secondary | ICD-10-CM | POA: Diagnosis not present

## 2018-08-15 DIAGNOSIS — Z3201 Encounter for pregnancy test, result positive: Secondary | ICD-10-CM | POA: Diagnosis not present

## 2018-08-15 DIAGNOSIS — N915 Oligomenorrhea, unspecified: Secondary | ICD-10-CM | POA: Diagnosis not present

## 2018-08-15 DIAGNOSIS — F419 Anxiety disorder, unspecified: Secondary | ICD-10-CM | POA: Diagnosis not present

## 2018-08-15 DIAGNOSIS — F329 Major depressive disorder, single episode, unspecified: Secondary | ICD-10-CM | POA: Diagnosis not present

## 2018-09-09 DIAGNOSIS — Z3481 Encounter for supervision of other normal pregnancy, first trimester: Secondary | ICD-10-CM | POA: Diagnosis not present

## 2018-09-09 DIAGNOSIS — Z113 Encounter for screening for infections with a predominantly sexual mode of transmission: Secondary | ICD-10-CM | POA: Diagnosis not present

## 2018-09-09 DIAGNOSIS — Z6841 Body Mass Index (BMI) 40.0 and over, adult: Secondary | ICD-10-CM | POA: Diagnosis not present

## 2018-09-09 DIAGNOSIS — Z3A11 11 weeks gestation of pregnancy: Secondary | ICD-10-CM | POA: Diagnosis not present

## 2018-09-09 DIAGNOSIS — N915 Oligomenorrhea, unspecified: Secondary | ICD-10-CM | POA: Diagnosis not present

## 2018-10-07 DIAGNOSIS — O368390 Maternal care for abnormalities of the fetal heart rate or rhythm, unspecified trimester, not applicable or unspecified: Secondary | ICD-10-CM | POA: Diagnosis not present

## 2018-11-05 DIAGNOSIS — Z3689 Encounter for other specified antenatal screening: Secondary | ICD-10-CM | POA: Diagnosis not present

## 2018-11-27 DIAGNOSIS — Z3A19 19 weeks gestation of pregnancy: Secondary | ICD-10-CM | POA: Diagnosis not present

## 2018-11-27 DIAGNOSIS — O99212 Obesity complicating pregnancy, second trimester: Secondary | ICD-10-CM | POA: Diagnosis not present

## 2018-11-27 DIAGNOSIS — Z3A22 22 weeks gestation of pregnancy: Secondary | ICD-10-CM | POA: Diagnosis not present

## 2018-11-27 DIAGNOSIS — Z363 Encounter for antenatal screening for malformations: Secondary | ICD-10-CM | POA: Diagnosis not present

## 2018-11-27 DIAGNOSIS — Z3689 Encounter for other specified antenatal screening: Secondary | ICD-10-CM | POA: Diagnosis not present

## 2018-11-27 DIAGNOSIS — Z36 Encounter for antenatal screening for chromosomal anomalies: Secondary | ICD-10-CM | POA: Diagnosis not present

## 2018-12-03 DIAGNOSIS — R030 Elevated blood-pressure reading, without diagnosis of hypertension: Secondary | ICD-10-CM | POA: Diagnosis not present

## 2018-12-03 DIAGNOSIS — O99212 Obesity complicating pregnancy, second trimester: Secondary | ICD-10-CM | POA: Diagnosis not present

## 2018-12-03 DIAGNOSIS — Z3A23 23 weeks gestation of pregnancy: Secondary | ICD-10-CM | POA: Diagnosis not present

## 2018-12-31 DIAGNOSIS — Z3482 Encounter for supervision of other normal pregnancy, second trimester: Secondary | ICD-10-CM | POA: Diagnosis not present

## 2018-12-31 DIAGNOSIS — Z3A27 27 weeks gestation of pregnancy: Secondary | ICD-10-CM | POA: Diagnosis not present

## 2018-12-31 DIAGNOSIS — Z23 Encounter for immunization: Secondary | ICD-10-CM | POA: Diagnosis not present

## 2018-12-31 DIAGNOSIS — Z113 Encounter for screening for infections with a predominantly sexual mode of transmission: Secondary | ICD-10-CM | POA: Diagnosis not present

## 2018-12-31 DIAGNOSIS — Z3689 Encounter for other specified antenatal screening: Secondary | ICD-10-CM | POA: Diagnosis not present

## 2019-01-02 DIAGNOSIS — M545 Low back pain: Secondary | ICD-10-CM | POA: Diagnosis not present

## 2019-01-02 DIAGNOSIS — Z3A27 27 weeks gestation of pregnancy: Secondary | ICD-10-CM | POA: Diagnosis not present

## 2019-01-02 DIAGNOSIS — M62830 Muscle spasm of back: Secondary | ICD-10-CM | POA: Diagnosis not present

## 2019-01-06 DIAGNOSIS — Z3A27 27 weeks gestation of pregnancy: Secondary | ICD-10-CM | POA: Diagnosis not present

## 2019-01-06 DIAGNOSIS — M545 Low back pain: Secondary | ICD-10-CM | POA: Diagnosis not present

## 2019-01-06 DIAGNOSIS — M542 Cervicalgia: Secondary | ICD-10-CM | POA: Diagnosis not present

## 2019-01-06 DIAGNOSIS — M62830 Muscle spasm of back: Secondary | ICD-10-CM | POA: Diagnosis not present

## 2019-01-07 DIAGNOSIS — Z6791 Unspecified blood type, Rh negative: Secondary | ICD-10-CM | POA: Diagnosis not present

## 2019-01-07 DIAGNOSIS — Z3A28 28 weeks gestation of pregnancy: Secondary | ICD-10-CM | POA: Diagnosis not present

## 2019-01-07 DIAGNOSIS — O26893 Other specified pregnancy related conditions, third trimester: Secondary | ICD-10-CM | POA: Diagnosis not present

## 2019-01-09 DIAGNOSIS — M62830 Muscle spasm of back: Secondary | ICD-10-CM | POA: Diagnosis not present

## 2019-01-09 DIAGNOSIS — Z3A27 27 weeks gestation of pregnancy: Secondary | ICD-10-CM | POA: Diagnosis not present

## 2019-01-09 DIAGNOSIS — M545 Low back pain: Secondary | ICD-10-CM | POA: Diagnosis not present

## 2019-01-09 DIAGNOSIS — M542 Cervicalgia: Secondary | ICD-10-CM | POA: Diagnosis not present

## 2019-01-13 DIAGNOSIS — M545 Low back pain: Secondary | ICD-10-CM | POA: Diagnosis not present

## 2019-01-13 DIAGNOSIS — M542 Cervicalgia: Secondary | ICD-10-CM | POA: Diagnosis not present

## 2019-01-13 DIAGNOSIS — M62830 Muscle spasm of back: Secondary | ICD-10-CM | POA: Diagnosis not present

## 2019-01-13 DIAGNOSIS — Z3A27 27 weeks gestation of pregnancy: Secondary | ICD-10-CM | POA: Diagnosis not present

## 2019-01-16 DIAGNOSIS — Z3A27 27 weeks gestation of pregnancy: Secondary | ICD-10-CM | POA: Diagnosis not present

## 2019-01-16 DIAGNOSIS — M62830 Muscle spasm of back: Secondary | ICD-10-CM | POA: Diagnosis not present

## 2019-01-16 DIAGNOSIS — M545 Low back pain: Secondary | ICD-10-CM | POA: Diagnosis not present

## 2019-01-16 DIAGNOSIS — M542 Cervicalgia: Secondary | ICD-10-CM | POA: Diagnosis not present

## 2019-01-27 DIAGNOSIS — Z3A27 27 weeks gestation of pregnancy: Secondary | ICD-10-CM | POA: Diagnosis not present

## 2019-01-27 DIAGNOSIS — M545 Low back pain: Secondary | ICD-10-CM | POA: Diagnosis not present

## 2019-01-27 DIAGNOSIS — M542 Cervicalgia: Secondary | ICD-10-CM | POA: Diagnosis not present

## 2019-01-27 DIAGNOSIS — M62830 Muscle spasm of back: Secondary | ICD-10-CM | POA: Diagnosis not present

## 2019-01-28 DIAGNOSIS — Z3689 Encounter for other specified antenatal screening: Secondary | ICD-10-CM | POA: Diagnosis not present

## 2019-01-28 DIAGNOSIS — O99213 Obesity complicating pregnancy, third trimester: Secondary | ICD-10-CM | POA: Diagnosis not present

## 2019-01-30 DIAGNOSIS — Z3A27 27 weeks gestation of pregnancy: Secondary | ICD-10-CM | POA: Diagnosis not present

## 2019-01-30 DIAGNOSIS — M62830 Muscle spasm of back: Secondary | ICD-10-CM | POA: Diagnosis not present

## 2019-01-30 DIAGNOSIS — M545 Low back pain: Secondary | ICD-10-CM | POA: Diagnosis not present

## 2019-01-30 DIAGNOSIS — M542 Cervicalgia: Secondary | ICD-10-CM | POA: Diagnosis not present

## 2019-02-04 DIAGNOSIS — Z3A27 27 weeks gestation of pregnancy: Secondary | ICD-10-CM | POA: Diagnosis not present

## 2019-02-04 DIAGNOSIS — M545 Low back pain: Secondary | ICD-10-CM | POA: Diagnosis not present

## 2019-02-04 DIAGNOSIS — M62830 Muscle spasm of back: Secondary | ICD-10-CM | POA: Diagnosis not present

## 2019-02-04 DIAGNOSIS — M542 Cervicalgia: Secondary | ICD-10-CM | POA: Diagnosis not present

## 2019-02-06 DIAGNOSIS — M62830 Muscle spasm of back: Secondary | ICD-10-CM | POA: Diagnosis not present

## 2019-02-06 DIAGNOSIS — M545 Low back pain: Secondary | ICD-10-CM | POA: Diagnosis not present

## 2019-02-06 DIAGNOSIS — Z3A27 27 weeks gestation of pregnancy: Secondary | ICD-10-CM | POA: Diagnosis not present

## 2019-02-06 DIAGNOSIS — M542 Cervicalgia: Secondary | ICD-10-CM | POA: Diagnosis not present

## 2019-02-10 DIAGNOSIS — Z3A27 27 weeks gestation of pregnancy: Secondary | ICD-10-CM | POA: Diagnosis not present

## 2019-02-10 DIAGNOSIS — M542 Cervicalgia: Secondary | ICD-10-CM | POA: Diagnosis not present

## 2019-02-10 DIAGNOSIS — M545 Low back pain: Secondary | ICD-10-CM | POA: Diagnosis not present

## 2019-02-10 DIAGNOSIS — M62830 Muscle spasm of back: Secondary | ICD-10-CM | POA: Diagnosis not present

## 2019-02-13 DIAGNOSIS — M542 Cervicalgia: Secondary | ICD-10-CM | POA: Diagnosis not present

## 2019-02-13 DIAGNOSIS — M545 Low back pain: Secondary | ICD-10-CM | POA: Diagnosis not present

## 2019-02-13 DIAGNOSIS — Z3A27 27 weeks gestation of pregnancy: Secondary | ICD-10-CM | POA: Diagnosis not present

## 2019-02-13 DIAGNOSIS — M62830 Muscle spasm of back: Secondary | ICD-10-CM | POA: Diagnosis not present

## 2019-02-17 DIAGNOSIS — M545 Low back pain: Secondary | ICD-10-CM | POA: Diagnosis not present

## 2019-02-17 DIAGNOSIS — Z3A27 27 weeks gestation of pregnancy: Secondary | ICD-10-CM | POA: Diagnosis not present

## 2019-02-17 DIAGNOSIS — M542 Cervicalgia: Secondary | ICD-10-CM | POA: Diagnosis not present

## 2019-02-17 DIAGNOSIS — M62830 Muscle spasm of back: Secondary | ICD-10-CM | POA: Diagnosis not present

## 2019-02-20 DIAGNOSIS — M545 Low back pain: Secondary | ICD-10-CM | POA: Diagnosis not present

## 2019-02-20 DIAGNOSIS — M542 Cervicalgia: Secondary | ICD-10-CM | POA: Diagnosis not present

## 2019-02-20 DIAGNOSIS — M62830 Muscle spasm of back: Secondary | ICD-10-CM | POA: Diagnosis not present

## 2019-02-20 DIAGNOSIS — Z3A27 27 weeks gestation of pregnancy: Secondary | ICD-10-CM | POA: Diagnosis not present

## 2019-02-22 DIAGNOSIS — F411 Generalized anxiety disorder: Secondary | ICD-10-CM | POA: Diagnosis not present

## 2019-02-22 DIAGNOSIS — F410 Panic disorder [episodic paroxysmal anxiety] without agoraphobia: Secondary | ICD-10-CM | POA: Diagnosis not present

## 2019-02-24 DIAGNOSIS — M62830 Muscle spasm of back: Secondary | ICD-10-CM | POA: Diagnosis not present

## 2019-02-24 DIAGNOSIS — M545 Low back pain: Secondary | ICD-10-CM | POA: Diagnosis not present

## 2019-02-24 DIAGNOSIS — M542 Cervicalgia: Secondary | ICD-10-CM | POA: Diagnosis not present

## 2019-02-24 DIAGNOSIS — Z3A27 27 weeks gestation of pregnancy: Secondary | ICD-10-CM | POA: Diagnosis not present

## 2019-02-25 DIAGNOSIS — Z3689 Encounter for other specified antenatal screening: Secondary | ICD-10-CM | POA: Diagnosis not present

## 2019-03-03 DIAGNOSIS — O99213 Obesity complicating pregnancy, third trimester: Secondary | ICD-10-CM | POA: Diagnosis not present

## 2019-03-03 DIAGNOSIS — Z3685 Encounter for antenatal screening for Streptococcus B: Secondary | ICD-10-CM | POA: Diagnosis not present

## 2019-03-04 DIAGNOSIS — M62830 Muscle spasm of back: Secondary | ICD-10-CM | POA: Diagnosis not present

## 2019-03-04 DIAGNOSIS — Z3A27 27 weeks gestation of pregnancy: Secondary | ICD-10-CM | POA: Diagnosis not present

## 2019-03-04 DIAGNOSIS — M542 Cervicalgia: Secondary | ICD-10-CM | POA: Diagnosis not present

## 2019-03-04 DIAGNOSIS — M545 Low back pain: Secondary | ICD-10-CM | POA: Diagnosis not present

## 2019-03-06 DIAGNOSIS — O99213 Obesity complicating pregnancy, third trimester: Secondary | ICD-10-CM | POA: Diagnosis not present

## 2019-03-10 DIAGNOSIS — N949 Unspecified condition associated with female genital organs and menstrual cycle: Secondary | ICD-10-CM | POA: Diagnosis not present

## 2019-03-10 DIAGNOSIS — O99213 Obesity complicating pregnancy, third trimester: Secondary | ICD-10-CM | POA: Diagnosis not present

## 2019-03-10 DIAGNOSIS — O26893 Other specified pregnancy related conditions, third trimester: Secondary | ICD-10-CM | POA: Diagnosis not present

## 2019-03-11 DIAGNOSIS — M62830 Muscle spasm of back: Secondary | ICD-10-CM | POA: Diagnosis not present

## 2019-03-11 DIAGNOSIS — Z3A27 27 weeks gestation of pregnancy: Secondary | ICD-10-CM | POA: Diagnosis not present

## 2019-03-11 DIAGNOSIS — M542 Cervicalgia: Secondary | ICD-10-CM | POA: Diagnosis not present

## 2019-03-11 DIAGNOSIS — M545 Low back pain: Secondary | ICD-10-CM | POA: Diagnosis not present

## 2019-03-13 DIAGNOSIS — O288 Other abnormal findings on antenatal screening of mother: Secondary | ICD-10-CM | POA: Diagnosis not present

## 2019-03-13 DIAGNOSIS — O368130 Decreased fetal movements, third trimester, not applicable or unspecified: Secondary | ICD-10-CM | POA: Diagnosis not present

## 2019-03-17 DIAGNOSIS — Z3A38 38 weeks gestation of pregnancy: Secondary | ICD-10-CM | POA: Diagnosis not present

## 2019-03-17 DIAGNOSIS — Z3483 Encounter for supervision of other normal pregnancy, third trimester: Secondary | ICD-10-CM | POA: Diagnosis not present

## 2019-03-18 DIAGNOSIS — M542 Cervicalgia: Secondary | ICD-10-CM | POA: Diagnosis not present

## 2019-03-18 DIAGNOSIS — M545 Low back pain: Secondary | ICD-10-CM | POA: Diagnosis not present

## 2019-03-18 DIAGNOSIS — M62830 Muscle spasm of back: Secondary | ICD-10-CM | POA: Diagnosis not present

## 2019-03-18 DIAGNOSIS — Z3A27 27 weeks gestation of pregnancy: Secondary | ICD-10-CM | POA: Diagnosis not present
